# Patient Record
Sex: Male | Born: 1970 | Race: White | Hispanic: No | Marital: Married | Smoking: Never smoker
Health system: Southern US, Community
[De-identification: ages and names within clinical notes are randomized; demographics above are authoritative.]

## PROBLEM LIST (undated history)

## (undated) DIAGNOSIS — I10 Essential (primary) hypertension: Secondary | ICD-10-CM

## (undated) DIAGNOSIS — Z87442 Personal history of urinary calculi: Secondary | ICD-10-CM

## (undated) DIAGNOSIS — E7800 Pure hypercholesterolemia, unspecified: Secondary | ICD-10-CM

## (undated) DIAGNOSIS — F329 Major depressive disorder, single episode, unspecified: Secondary | ICD-10-CM

## (undated) DIAGNOSIS — F32A Depression, unspecified: Secondary | ICD-10-CM

## (undated) DIAGNOSIS — E785 Hyperlipidemia, unspecified: Secondary | ICD-10-CM

## (undated) HISTORY — DX: Hyperlipidemia, unspecified: E78.5

---

## 2008-02-05 ENCOUNTER — Ambulatory Visit: Admission: RE | Admit: 2008-02-05 | Discharge: 2008-02-05 | Payer: Self-pay | Admitting: Internal Medicine

## 2011-01-19 NOTE — Procedures (Signed)
Logan Garrett, Logan Garrett                 ACCOUNT NO.:  [id number]   MEDICAL RECORD NO.:  [id number]          PATIENT TYPE:  OUT   LOCATION:  SLEEP                         FACILITY:  [facility]   PHYSICIAN:  Kofi A. Gerilyn Pilgrim, M.D. DATE OF BIRTH:  Sep 20, 1970   DATE OF PROCEDURE:  02/05/2008  DATE OF DISCHARGE:                             SLEEP DISORDER REPORT   NOCTURNAL POLYSOMNOGRAPHY REPORT   REFERRING PHYSICIAN:  Catalina Pizza, M.D.   INDICATION:  A 40 year old man who presented with snoring, apnea, and  daytime sleepiness.  Epworth sleepiness scale 10.  BMI 28.   MEDICATION:  Claritin and testosterone.   SLEEP STAGE SUMMARY:  The total recording time is 401 minutes.  Sleep  efficiency 89%.  Sleep latency 60 minutes.  REM latency 176 minutes.  Stage N1 6%, N2 58%, N3 27%, and REM sleep 9%.   RESPIRATORY SUMMARY:  Baseline oxygen saturation 95% with lowest  saturation 80%.  The AHI is 13.   LIMB MOVEMENT SUMMARY:  PLM index is 0.7.   ELECTROCARDIOGRAM SUMMARY:  Average heart rate is 63 with no  dysrhythmias observed.   IMPRESSION:  Mild obstructive sleep apnea syndrome.  The patient did not  meet the criteria for a split night study, but could benefit from  positive pressure.  I suggest a home trial with autotitration unit or a  formal titration study.   Thanks for this referral.      Kofi A. Gerilyn Pilgrim, M.D.  Electronically Signed     KAD/MEDQ  D:  02/10/2008  T:  02/11/2008  Job:  [id number]

## 2012-04-13 ENCOUNTER — Encounter (HOSPITAL_COMMUNITY): Payer: Self-pay

## 2012-04-13 ENCOUNTER — Emergency Department (HOSPITAL_COMMUNITY)
Admission: EM | Admit: 2012-04-13 | Discharge: 2012-04-13 | Disposition: A | Discharge status: Home / Self Care | Payer: BC Managed Care – PPO | Attending: Emergency Medicine | Admitting: Emergency Medicine

## 2012-04-13 ENCOUNTER — Emergency Department (HOSPITAL_COMMUNITY): Payer: BC Managed Care – PPO

## 2012-04-13 DIAGNOSIS — Z79899 Other long term (current) drug therapy: Secondary | ICD-10-CM | POA: Insufficient documentation

## 2012-04-13 DIAGNOSIS — I10 Essential (primary) hypertension: Secondary | ICD-10-CM | POA: Insufficient documentation

## 2012-04-13 DIAGNOSIS — E7800 Pure hypercholesterolemia, unspecified: Secondary | ICD-10-CM | POA: Insufficient documentation

## 2012-04-13 DIAGNOSIS — N200 Calculus of kidney: Secondary | ICD-10-CM | POA: Insufficient documentation

## 2012-04-13 HISTORY — DX: Essential (primary) hypertension: I10

## 2012-04-13 HISTORY — DX: Pure hypercholesterolemia, unspecified: E78.00

## 2012-04-13 LAB — CBC WITH DIFFERENTIAL/PLATELET
Basophils Relative: 3 % — ABNORMAL HIGH (ref 0–1)
HCT: 46.2 % (ref 39.0–52.0)
Hemoglobin: 15.7 g/dL (ref 13.0–17.0)
Lymphocytes Relative: 45 % (ref 12–46)
MCHC: 34 g/dL (ref 30.0–36.0)
Monocytes Relative: 10 % (ref 3–12)
Neutro Abs: 2.8 10*3/uL (ref 1.7–7.7)
WBC: 7.3 10*3/uL (ref 4.0–10.5)

## 2012-04-13 LAB — URINALYSIS, ROUTINE W REFLEX MICROSCOPIC
Glucose, UA: NEGATIVE mg/dL
Ketones, ur: NEGATIVE mg/dL
pH: 6 (ref 5.0–8.0)

## 2012-04-13 LAB — COMPREHENSIVE METABOLIC PANEL
ALT: 58 U/L — ABNORMAL HIGH (ref 0–53)
Alkaline Phosphatase: 105 U/L (ref 39–117)
CO2: 27 mEq/L (ref 19–32)
GFR calc Af Amer: 82 mL/min — ABNORMAL LOW (ref >90)
GFR calc non Af Amer: 71 mL/min — ABNORMAL LOW (ref >90)
Glucose, Bld: 134 mg/dL — ABNORMAL HIGH (ref 70–99)
Potassium: 3.7 mEq/L (ref 3.5–5.1)
Sodium: 140 mEq/L (ref 135–145)

## 2012-04-13 LAB — URINE MICROSCOPIC-ADD ON

## 2012-04-13 MED ORDER — TAMSULOSIN HCL 0.4 MG PO CAPS: 0.4000 mg | ORAL_CAPSULE | Freq: Every day | ORAL | Status: DC

## 2012-04-13 MED ORDER — OXYCODONE-ACETAMINOPHEN 5-325 MG PO TABS: 2.0000 | ORAL_TABLET | ORAL | Status: AC | PRN

## 2012-04-13 MED ORDER — ONDANSETRON 8 MG PO TBDP: 8.0000 mg | ORAL_TABLET | Freq: Three times a day (TID) | ORAL | Status: AC | PRN

## 2012-04-13 NOTE — ED Notes (Signed)
Pt reports (L) lower back pain starting 0930 this am, no hx of the same, pt pale, restless. Pt denies hx of kidney stones

## 2012-04-13 NOTE — ED Provider Notes (Signed)
History     CSN: [id number]  Arrival date & time 04/13/12  1000   First MD Initiated Contact with Patient 04/13/12 1104      Chief Complaint  Patient presents with  . Back Pain    (Consider location/radiation/quality/duration/timing/severity/associated sxs/prior treatment) Patient is a 41 y.o. male presenting with back pain. The history is provided by the patient.  Back Pain    patient here with left-sided flank pain which began at 9:30 this morning. Pain is described as colicky. States that prior to this he thought he had a urinary tract infection and has been taking amoxicillin for this. Denies any history of kidney stone. Pain is sharp radiates down to his left groin. Denies any testicular pain. No penile drainage or discharge. No medications used prior to arrival he is currently pain-free  Past Medical History  Diagnosis Date  . High cholesterol   . Hypertension     History reviewed. No pertinent past surgical history.  History reviewed. No pertinent family history.  History  Substance Use Topics  . Smoking status: Never Smoker   . Smokeless tobacco: Not on file  . Alcohol Use: Yes      Review of Systems  Musculoskeletal: Positive for back pain.  All other systems reviewed and are negative.    Allergies  Review of patient's allergies indicates no known allergies.  Home Medications   Current Outpatient Rx  Name Route Sig Dispense Refill  . AMOXICILLIN 500 MG PO CAPS Oral Take 1,000 mg by mouth 2 (two) times daily.    Marland Kitchen LORATADINE 10 MG PO TABS Oral Take 10 mg by mouth daily.    Marland Kitchen ROSUVASTATIN CALCIUM 20 MG PO TABS Oral Take 20 mg by mouth daily.    . SERTRALINE HCL 100 MG PO TABS Oral Take 100 mg by mouth daily.    . TESTOSTERONE 20.25 MG/ACT (1.62%) TD GEL Transdermal Place 81 mg onto the skin daily. 81mg =4 pumps    . VALSARTAN-HYDROCHLOROTHIAZIDE 80-12.5 MG PO TABS Oral Take 1 tablet by mouth daily.      BP 145/88  Pulse 69  Temp 97.3 F (36.3 C)  (Oral)  Resp 22  SpO2 100%  Physical Exam  Nursing note and vitals reviewed. Constitutional: He is oriented to person, place, and time. He appears well-developed and well-nourished.  Non-toxic appearance. No distress.  HENT:  Head: Normocephalic and atraumatic.  Eyes: Conjunctivae, EOM and lids are normal. Pupils are equal, round, and reactive to light.  Neck: Normal range of motion. Neck supple. No tracheal deviation present. No mass present.  Cardiovascular: Normal rate, regular rhythm and normal heart sounds.  Exam reveals no gallop.   No murmur heard. Pulmonary/Chest: Effort normal and breath sounds normal. No stridor. No respiratory distress. He has no decreased breath sounds. He has no wheezes. He has no rhonchi. He has no rales.  Abdominal: Soft. Normal appearance and bowel sounds are normal. He exhibits no distension. There is no tenderness. There is no rebound and no CVA tenderness.  Musculoskeletal: Normal range of motion. He exhibits no edema and no tenderness.  Neurological: He is alert and oriented to person, place, and time. He has normal strength. No cranial nerve deficit or sensory deficit. GCS eye subscore is 4. GCS verbal subscore is 5. GCS motor subscore is 6.  Skin: Skin is warm and dry. No abrasion and no rash noted.  Psychiatric: He has a normal mood and affect. His speech is normal and behavior is normal.  ED Course  Procedures (including critical care time)   Labs Reviewed  URINALYSIS, ROUTINE W REFLEX MICROSCOPIC  CBC WITH DIFFERENTIAL  COMPREHENSIVE METABOLIC PANEL  URINE CULTURE   No results found.   No diagnosis found.    MDM  Pt with left sided kidney stone, no pain currently, stabel for d/c        Toy Baker, MD 04/13/12 1250
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
## 2017-10-21 ENCOUNTER — Other Ambulatory Visit (HOSPITAL_COMMUNITY): Payer: Self-pay | Admitting: Psychiatry

## 2017-10-21 ENCOUNTER — Encounter (HOSPITAL_COMMUNITY): Payer: Self-pay | Admitting: Psychiatry

## 2017-10-21 ENCOUNTER — Ambulatory Visit (INDEPENDENT_AMBULATORY_CARE_PROVIDER_SITE_OTHER): Payer: Managed Care, Other (non HMO) | Admitting: Psychiatry

## 2017-10-21 ENCOUNTER — Encounter (INDEPENDENT_AMBULATORY_CARE_PROVIDER_SITE_OTHER): Payer: Self-pay

## 2017-10-21 VITALS — BP 119/79 | HR 78 | Ht 76.0 in | Wt 234.0 lb

## 2017-10-21 DIAGNOSIS — Z79899 Other long term (current) drug therapy: Secondary | ICD-10-CM

## 2017-10-21 DIAGNOSIS — F0630 Mood disorder due to known physiological condition, unspecified: Secondary | ICD-10-CM

## 2017-10-21 DIAGNOSIS — Z566 Other physical and mental strain related to work: Secondary | ICD-10-CM | POA: Diagnosis not present

## 2017-10-21 DIAGNOSIS — F329 Major depressive disorder, single episode, unspecified: Secondary | ICD-10-CM | POA: Diagnosis not present

## 2017-10-21 DIAGNOSIS — F1099 Alcohol use, unspecified with unspecified alcohol-induced disorder: Secondary | ICD-10-CM | POA: Diagnosis not present

## 2017-10-21 LAB — TSH: TSH: 2.3 mIU/L (ref 0.40–4.50)

## 2017-10-21 MED ORDER — SERTRALINE HCL 100 MG PO TABS: 100.0000 mg | ORAL_TABLET | Freq: Every day | ORAL | 0 refills | Status: DC

## 2017-10-21 MED ORDER — ARIPIPRAZOLE 2 MG PO TABS: 2.0000 mg | ORAL_TABLET | Freq: Every day | ORAL | 0 refills | Status: DC

## 2017-10-21 NOTE — Patient Instructions (Signed)
1. Continue sertraline 100 mg daily  2. Start Abilify 2 mg daily  3. Return to clinic in one month for 30 mins 4. Referral to therapy 5. Order thyroid test

## 2017-10-26 NOTE — Addendum Note (Signed)
Addended by: Luella CookMCINTYRE, Brevan Luberto E on: 10/26/2017 01:54 PM   Modules accepted: Orders

## 2017-11-16 ENCOUNTER — Other Ambulatory Visit (HOSPITAL_COMMUNITY): Payer: Self-pay | Admitting: Psychiatry

## 2017-11-16 MED ORDER — SERTRALINE HCL 100 MG PO TABS: 100.0000 mg | ORAL_TABLET | Freq: Every day | ORAL | 0 refills | Status: DC

## 2017-11-17 ENCOUNTER — Telehealth (HOSPITAL_COMMUNITY): Payer: Self-pay | Admitting: Psychiatry

## 2017-11-17 ENCOUNTER — Other Ambulatory Visit (HOSPITAL_COMMUNITY): Payer: Self-pay | Admitting: Psychiatry

## 2017-11-17 MED ORDER — ARIPIPRAZOLE 2 MG PO TABS: 2.0000 mg | ORAL_TABLET | Freq: Every day | ORAL | 0 refills | Status: DC

## 2017-11-17 NOTE — Telephone Encounter (Signed)
Received request for Abilify, ordered for a month. Please contact the patient to make follow up appointment.

## 2017-11-18 ENCOUNTER — Telehealth (HOSPITAL_COMMUNITY): Payer: Self-pay

## 2017-11-18 NOTE — Telephone Encounter (Signed)
Neysa HotterHisada, Reina, MD  Milus Mallickatum, Mertha Clyatt L, CMA        Received request for sertraline and ordered for a month. Please contact the patient to make follow up appointment     Called patient and left message on voicemail at home number asking the patient to please call the office to schedule a follow up appt. Also left message that prescription was sent to the pharmacy
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
## 2017-11-29 ENCOUNTER — Telehealth (HOSPITAL_COMMUNITY): Payer: Self-pay | Admitting: Psychiatry

## 2017-11-29 ENCOUNTER — Ambulatory Visit (INDEPENDENT_AMBULATORY_CARE_PROVIDER_SITE_OTHER): Payer: Managed Care, Other (non HMO) | Admitting: Psychiatry

## 2017-11-29 ENCOUNTER — Encounter (HOSPITAL_COMMUNITY): Payer: Self-pay | Admitting: Psychiatry

## 2017-11-29 VITALS — BP 128/90 | HR 87 | Ht 76.0 in | Wt 239.0 lb

## 2017-11-29 DIAGNOSIS — F39 Unspecified mood [affective] disorder: Secondary | ICD-10-CM

## 2017-11-29 DIAGNOSIS — Z79899 Other long term (current) drug therapy: Secondary | ICD-10-CM | POA: Diagnosis not present

## 2017-11-29 DIAGNOSIS — F0630 Mood disorder due to known physiological condition, unspecified: Secondary | ICD-10-CM

## 2017-11-29 DIAGNOSIS — F1011 Alcohol abuse, in remission: Secondary | ICD-10-CM

## 2017-11-29 MED ORDER — SERTRALINE HCL 100 MG PO TABS: 100.0000 mg | ORAL_TABLET | Freq: Every day | ORAL | 0 refills | Status: DC

## 2017-11-29 MED ORDER — ARIPIPRAZOLE 2 MG PO TABS: 2.0000 mg | ORAL_TABLET | Freq: Every day | ORAL | 0 refills | Status: DC

## 2017-11-29 NOTE — Telephone Encounter (Signed)
TSH 2.3  Please update the patient that the blood test (thyroid) in Feb is within normal range.

## 2017-11-29 NOTE — Patient Instructions (Signed)
1. Continue sertraline 100 mg daily  2. Continue Abilify 2 mg daily  3. Return to clinic in two months for 30 mins

## 2017-11-30 ENCOUNTER — Encounter (HOSPITAL_COMMUNITY): Payer: Self-pay

## 2018-07-31 ENCOUNTER — Other Ambulatory Visit: Payer: Self-pay

## 2018-07-31 ENCOUNTER — Ambulatory Visit (HOSPITAL_COMMUNITY)
Admission: EM | Admit: 2018-07-31 | Discharge: 2018-07-31 | Disposition: A | Discharge status: Home / Self Care | Payer: 59 | Source: Ambulatory Visit | Attending: Orthopedic Surgery | Admitting: Orthopedic Surgery

## 2018-07-31 ENCOUNTER — Encounter (HOSPITAL_COMMUNITY)
Admission: EM | Disposition: A | Discharge status: Home / Self Care | Payer: Self-pay | Source: Home / Self Care | Attending: Emergency Medicine

## 2018-07-31 ENCOUNTER — Emergency Department (HOSPITAL_COMMUNITY): Payer: 59

## 2018-07-31 ENCOUNTER — Encounter (HOSPITAL_COMMUNITY): Payer: Self-pay | Admitting: Emergency Medicine

## 2018-07-31 ENCOUNTER — Encounter (HOSPITAL_COMMUNITY): Payer: Self-pay | Admitting: *Deleted

## 2018-07-31 ENCOUNTER — Inpatient Hospital Stay (HOSPITAL_COMMUNITY): Payer: 59 | Admitting: Certified Registered Nurse Anesthetist

## 2018-07-31 ENCOUNTER — Encounter (HOSPITAL_COMMUNITY)
Admission: EM | Disposition: A | Discharge status: Home / Self Care | Payer: Self-pay | Source: Ambulatory Visit | Attending: Orthopedic Surgery

## 2018-07-31 ENCOUNTER — Emergency Department (HOSPITAL_COMMUNITY)
Admission: EM | Admit: 2018-07-31 | Discharge: 2018-07-31 | Disposition: A | Discharge status: Home / Self Care | Payer: 59 | Source: Home / Self Care | Attending: Emergency Medicine | Admitting: Emergency Medicine

## 2018-07-31 DIAGNOSIS — G5602 Carpal tunnel syndrome, left upper limb: Secondary | ICD-10-CM | POA: Insufficient documentation

## 2018-07-31 DIAGNOSIS — S022XXB Fracture of nasal bones, initial encounter for open fracture: Secondary | ICD-10-CM

## 2018-07-31 DIAGNOSIS — S63095A Other dislocation of left wrist and hand, initial encounter: Secondary | ICD-10-CM | POA: Insufficient documentation

## 2018-07-31 DIAGNOSIS — Y929 Unspecified place or not applicable: Secondary | ICD-10-CM | POA: Insufficient documentation

## 2018-07-31 DIAGNOSIS — W1830XA Fall on same level, unspecified, initial encounter: Secondary | ICD-10-CM | POA: Diagnosis not present

## 2018-07-31 DIAGNOSIS — I10 Essential (primary) hypertension: Secondary | ICD-10-CM | POA: Insufficient documentation

## 2018-07-31 DIAGNOSIS — Z79899 Other long term (current) drug therapy: Secondary | ICD-10-CM | POA: Diagnosis not present

## 2018-07-31 HISTORY — DX: Personal history of urinary calculi: Z87.442

## 2018-07-31 HISTORY — PX: ORIF WRIST FRACTURE: SHX2133

## 2018-07-31 LAB — CBC
HCT: 50.1 % (ref 39.0–52.0)
Hemoglobin: 16.4 g/dL (ref 13.0–17.0)
MCH: 31.8 pg (ref 26.0–34.0)
MCHC: 32.7 g/dL (ref 30.0–36.0)
MCV: 97.1 fL (ref 80.0–100.0)
NRBC: 0 % (ref 0.0–0.2)
PLATELETS: 252 10*3/uL (ref 150–400)
RBC: 5.16 MIL/uL (ref 4.22–5.81)
RDW: 12.4 % (ref 11.5–15.5)
WBC: 12 10*3/uL — AB (ref 4.0–10.5)

## 2018-07-31 SURGERY — OPEN REDUCTION INTERNAL FIXATION (ORIF) WRIST FRACTURE
Anesthesia: General | Laterality: Left

## 2018-07-31 SURGERY — OPEN REDUCTION INTERNAL FIXATION (ORIF) WRIST FRACTURE
Anesthesia: Choice | Laterality: Left

## 2018-07-31 MED ORDER — ONDANSETRON HCL 4 MG/2ML IJ SOLN: 4.0000 mg | Freq: Once | INTRAMUSCULAR | Status: DC

## 2018-07-31 MED ORDER — GLYCOPYRROLATE 0.2 MG/ML IJ SOLN
INTRAMUSCULAR | Status: DC | PRN
  Administered 2018-07-31: 0.2 mg via INTRAVENOUS

## 2018-07-31 MED ORDER — FENTANYL CITRATE (PF) 100 MCG/2ML IJ SOLN
50.0000 ug | Freq: Once | INTRAMUSCULAR | Status: AC
  Administered 2018-07-31: 50 ug via INTRAVENOUS

## 2018-07-31 MED ORDER — ONDANSETRON HCL 4 MG/2ML IJ SOLN
INTRAMUSCULAR | Status: DC | PRN
  Administered 2018-07-31: 4 mg via INTRAVENOUS

## 2018-07-31 MED ORDER — CEFAZOLIN SODIUM-DEXTROSE 2-3 GM-%(50ML) IV SOLR
INTRAVENOUS | Status: DC | PRN
  Administered 2018-07-31: 2 g via INTRAVENOUS

## 2018-07-31 MED ORDER — PROPOFOL 10 MG/ML IV BOLUS
INTRAVENOUS | Status: DC | PRN
  Administered 2018-07-31: 150 mg via INTRAVENOUS
  Administered 2018-07-31 (×2): 50 mg via INTRAVENOUS

## 2018-07-31 MED ORDER — TETANUS-DIPHTH-ACELL PERTUSSIS 5-2.5-18.5 LF-MCG/0.5 IM SUSP
0.5000 mL | Freq: Once | INTRAMUSCULAR | Status: AC
  Administered 2018-07-31: 0.5 mL via INTRAMUSCULAR

## 2018-07-31 MED ORDER — SUCCINYLCHOLINE CHLORIDE 200 MG/10ML IV SOSY
PREFILLED_SYRINGE | INTRAVENOUS | Status: DC | PRN
  Administered 2018-07-31: 100 mg via INTRAVENOUS

## 2018-07-31 MED ORDER — OXYCODONE-ACETAMINOPHEN 5-325 MG PO TABS: 1.0000 | ORAL_TABLET | ORAL | 0 refills | Status: AC | PRN

## 2018-07-31 MED ORDER — DEXAMETHASONE SODIUM PHOSPHATE 10 MG/ML IJ SOLN
INTRAMUSCULAR | Status: DC | PRN
  Administered 2018-07-31: 10 mg via INTRAVENOUS

## 2018-07-31 MED ORDER — CEFAZOLIN SODIUM-DEXTROSE 2-4 GM/100ML-% IV SOLN
INTRAVENOUS | Status: AC
  Filled 2018-07-31: qty 100

## 2018-07-31 MED ORDER — 0.9 % SODIUM CHLORIDE (POUR BTL) OPTIME
TOPICAL | Status: DC | PRN
  Administered 2018-07-31: 1000 mL

## 2018-07-31 MED ORDER — LIDOCAINE 2% (20 MG/ML) 5 ML SYRINGE
INTRAMUSCULAR | Status: DC | PRN
  Administered 2018-07-31: 100 mg via INTRAVENOUS

## 2018-07-31 MED ORDER — BACITRACIN ZINC 500 UNIT/GM EX OINT
TOPICAL_OINTMENT | CUTANEOUS | Status: AC
  Filled 2018-07-31: qty 0.9

## 2018-07-31 MED ORDER — FENTANYL CITRATE (PF) 100 MCG/2ML IJ SOLN
INTRAMUSCULAR | Status: AC
  Administered 2018-07-31: 50 ug via INTRAVENOUS
  Filled 2018-07-31: qty 2

## 2018-07-31 MED ORDER — MIDAZOLAM HCL 2 MG/2ML IJ SOLN
INTRAMUSCULAR | Status: AC
  Filled 2018-07-31: qty 2

## 2018-07-31 MED ORDER — HYDROMORPHONE HCL 1 MG/ML IJ SOLN: 0.2500 mg | INTRAMUSCULAR | Status: DC | PRN

## 2018-07-31 MED ORDER — SUFENTANIL CITRATE 50 MCG/ML IV SOLN
INTRAVENOUS | Status: DC | PRN
  Administered 2018-07-31: 10 ug via INTRAVENOUS
  Administered 2018-07-31: 20 ug via INTRAVENOUS

## 2018-07-31 MED ORDER — LACTATED RINGERS IV SOLN
INTRAVENOUS | Status: DC | PRN
  Administered 2018-07-31 (×2): via INTRAVENOUS

## 2018-07-31 MED ORDER — ACETAMINOPHEN 325 MG PO TABS
650.0000 mg | ORAL_TABLET | Freq: Once | ORAL | Status: AC
  Administered 2018-07-31: 650 mg via ORAL
  Filled 2018-07-31: qty 2

## 2018-07-31 MED ORDER — BUPIVACAINE-EPINEPHRINE (PF) 0.5% -1:200000 IJ SOLN
INTRAMUSCULAR | Status: DC | PRN
  Administered 2018-07-31: 30 mL via PERINEURAL

## 2018-07-31 MED ORDER — EPHEDRINE SULFATE 50 MG/ML IJ SOLN
INTRAMUSCULAR | Status: DC | PRN
  Administered 2018-07-31 (×3): 10 mg via INTRAVENOUS

## 2018-07-31 MED ORDER — HYDROMORPHONE HCL 1 MG/ML IJ SOLN: 1.0000 mg | Freq: Once | INTRAMUSCULAR | Status: DC

## 2018-07-31 SURGICAL SUPPLY — 60 items
BANDAGE ACE 3X5.8 VEL STRL LF (GAUZE/BANDAGES/DRESSINGS) ×3 IMPLANT
BANDAGE ACE 4X5 VEL STRL LF (GAUZE/BANDAGES/DRESSINGS) ×3 IMPLANT
BANDAGE ELASTIC 3 VELCRO ST LF (GAUZE/BANDAGES/DRESSINGS) ×3 IMPLANT
BANDAGE ELASTIC 4 VELCRO ST LF (GAUZE/BANDAGES/DRESSINGS) ×3 IMPLANT
BNDG CMPR 9X4 STRL LF SNTH (GAUZE/BANDAGES/DRESSINGS) ×1
BNDG ESMARK 4X9 LF (GAUZE/BANDAGES/DRESSINGS) ×3 IMPLANT
BNDG GAUZE ELAST 4 BULKY (GAUZE/BANDAGES/DRESSINGS) ×3 IMPLANT
CORDS BIPOLAR (ELECTRODE) ×3 IMPLANT
COVER SURGICAL LIGHT HANDLE (MISCELLANEOUS) ×3 IMPLANT
COVER WAND RF STERILE (DRAPES) ×3 IMPLANT
CUFF TOURNIQUET SINGLE 18IN (TOURNIQUET CUFF) ×3 IMPLANT
CUFF TOURNIQUET SINGLE 24IN (TOURNIQUET CUFF) IMPLANT
DRAIN PENROSE 1/4X12 LTX STRL (WOUND CARE) ×3 IMPLANT
DRAPE OEC MINIVIEW 54X84 (DRAPES) IMPLANT
DRAPE SURG 17X23 STRL (DRAPES) ×3 IMPLANT
DURAPREP 26ML APPLICATOR (WOUND CARE) ×3 IMPLANT
ELECT REM PT RETURN 9FT ADLT (ELECTROSURGICAL)
ELECTRODE REM PT RTRN 9FT ADLT (ELECTROSURGICAL) IMPLANT
GAUZE SPONGE 4X4 12PLY STRL (GAUZE/BANDAGES/DRESSINGS) ×3 IMPLANT
GAUZE SPONGE 4X4 12PLY STRL LF (GAUZE/BANDAGES/DRESSINGS) ×6 IMPLANT
GAUZE XEROFORM 1X8 LF (GAUZE/BANDAGES/DRESSINGS) ×3 IMPLANT
GLOVE SURG SYN 8.0 (GLOVE) ×3 IMPLANT
GOWN STRL REUS W/ TWL LRG LVL3 (GOWN DISPOSABLE) ×1 IMPLANT
GOWN STRL REUS W/ TWL XL LVL3 (GOWN DISPOSABLE) ×1 IMPLANT
GOWN STRL REUS W/TWL LRG LVL3 (GOWN DISPOSABLE) ×3
GOWN STRL REUS W/TWL XL LVL3 (GOWN DISPOSABLE) ×3
GUIDEWIRE ORTH 6X062XTROC NS (WIRE) ×3 IMPLANT
K-WIRE .062 (WIRE) ×9
KIT BASIN OR (CUSTOM PROCEDURE TRAY) ×3 IMPLANT
KIT TURNOVER KIT B (KITS) ×3 IMPLANT
MANIFOLD NEPTUNE II (INSTRUMENTS) ×3 IMPLANT
NEEDLE HYPO 25GX1X1/2 BEV (NEEDLE) IMPLANT
NEEDLE HYPO 25X1 1.5 SAFETY (NEEDLE) IMPLANT
NS IRRIG 1000ML POUR BTL (IV SOLUTION) ×3 IMPLANT
PACK ORTHO EXTREMITY (CUSTOM PROCEDURE TRAY) ×3 IMPLANT
PAD ARMBOARD 7.5X6 YLW CONV (MISCELLANEOUS) ×6 IMPLANT
PAD CAST 3X4 CTTN HI CHSV (CAST SUPPLIES) ×1 IMPLANT
PAD CAST 4YDX4 CTTN HI CHSV (CAST SUPPLIES) ×1 IMPLANT
PADDING CAST COTTON 3X4 STRL (CAST SUPPLIES) ×3
PADDING CAST COTTON 4X4 STRL (CAST SUPPLIES) ×3
PADDING CAST SYNTHETIC 3 NS LF (CAST SUPPLIES) ×2
PADDING CAST SYNTHETIC 3X4 NS (CAST SUPPLIES) ×1 IMPLANT
PADDING CAST SYNTHETIC 4 (CAST SUPPLIES) ×2
PADDING CAST SYNTHETIC 4X4 STR (CAST SUPPLIES) ×1 IMPLANT
PENCIL BUTTON HOLSTER BLD 10FT (ELECTRODE) IMPLANT
SPLINT FIBERGLASS 4X15 (CAST SUPPLIES) ×3 IMPLANT
SPONGE LAP 4X18 RFD (DISPOSABLE) ×6 IMPLANT
SUT 0 0 DBL MH NEEDLE ETHIBOND (SUTURE) ×3 IMPLANT
SUT ETHIBOND NAB CT1 #1 30IN (SUTURE) ×3 IMPLANT
SUT ETHILON 4 0 PS 2 18 (SUTURE) ×12 IMPLANT
SUT PROLENE 3 0 PS 2 (SUTURE) IMPLANT
SUT VIC AB 3-0 FS2 27 (SUTURE) ×3 IMPLANT
SUT VICRYL 4-0 PS2 18IN ABS (SUTURE) IMPLANT
SYR CONTROL 10ML LL (SYRINGE) IMPLANT
TOWEL OR 17X24 6PK STRL BLUE (TOWEL DISPOSABLE) ×3 IMPLANT
TOWEL OR 17X26 10 PK STRL BLUE (TOWEL DISPOSABLE) ×3 IMPLANT
TUBE CONNECTING 12'X1/4 (SUCTIONS)
TUBE CONNECTING 12X1/4 (SUCTIONS) IMPLANT
UNDERPAD 30X30 (UNDERPADS AND DIAPERS) ×3 IMPLANT
WATER STERILE IRR 1000ML POUR (IV SOLUTION) ×3 IMPLANT

## 2018-07-31 NOTE — Transfer of Care (Signed)
Immediate Anesthesia Transfer of Care Note  Patient: Logan Garrett  Procedure(s) Performed: OPEN REDUCTION INTERNAL FIXATION (ORIF) LEFT PERILUNATE DISLOCATION (Left )  Patient Location: PACU  Anesthesia Type:General and GA combined with regional for post-op pain  Level of Consciousness: oriented, sedated, drowsy, patient cooperative and responds to stimulation  Airway & Oxygen Therapy: Patient Spontanous Breathing  Post-op Assessment: Report given to RN and Post -op Vital signs reviewed and stable  Post vital signs: Reviewed and stable  Last Vitals:  Vitals Value Taken Time  BP 150/90 07/31/2018  9:51 PM  Temp    Pulse 94 07/31/2018  9:51 PM  Resp 12 07/31/2018  9:51 PM  SpO2 97 % 07/31/2018  9:51 PM  Vitals shown include unvalidated device data.  Last Pain:  Vitals:   07/31/18 1942  PainSc: 0-No pain      Patients Stated Pain Goal: 0 (07/31/18 1924)  Complications: No apparent anesthesia complications

## 2018-07-31 NOTE — ED Provider Notes (Signed)
[facility] EMERGENCY DEPARTMENT Provider Note   CSN: [id number] Arrival date & time: 07/31/18  1249     History   Chief Complaint Chief Complaint  Patient presents with  . Fall    HPI Logan Garrett is a 47 y.o. male.  HPI   Logan Garrett is a 47 y.o. male who presents to the Emergency Department complaining of left wrist pain and pain to his nose after a fall.  He states that he was cutting wood and slipped and fell face down onto a piece of wood.  He reports pain and bleeding of his nose with swelling.  And pain with movement to his left wrist.  He is applied pressure to his nose with minimal improvement of the bleeding.  He denies headache, dizziness, LOC, neck or back pain and vomiting.  Past Medical History:  Diagnosis Date  . High cholesterol   . Hyperlipidemia   . Hypertension     Patient Active Problem List   Diagnosis Date Noted  . Mood disorder in conditions classified elsewhere 10/21/2017    History reviewed. No pertinent surgical history.    Home Medications    Prior to Admission medications   Medication Sig Start Date End Date Taking? Authorizing Provider  amoxicillin (AMOXIL) 500 MG capsule Take 1,000 mg by mouth 2 (two) times daily.    [provider]  ARIPiprazole (ABILIFY) 2 MG tablet Take 1 tablet (2 mg total) by mouth daily. 11/29/17   Neysa Hotter, MD  loratadine (CLARITIN) 10 MG tablet Take 10 mg by mouth daily.    [provider]  rosuvastatin (CRESTOR) 20 MG tablet Take 20 mg by mouth daily.    [provider]  rosuvastatin (CRESTOR) 20 MG tablet Take 20 mg by mouth daily.    [provider]  sertraline (ZOLOFT) 100 MG tablet Take 100 mg by mouth daily.    [provider]  sertraline (ZOLOFT) 100 MG tablet Take 1 tablet (100 mg total) by mouth daily. 11/29/17   Neysa Hotter, MD  Tamsulosin HCl (FLOMAX) 0.4 MG CAPS Take 1 capsule (0.4 mg total) by mouth daily. 04/13/12   Lorre Nick, MD    Testosterone (ANDROGEL PUMP) 20.25 MG/ACT (1.62%) GEL Place 81 mg onto the skin daily. 81mg =4 pumps    [provider]  valsartan-hydrochlorothiazide (DIOVAN-HCT) 80-12.5 MG per tablet Take 1 tablet by mouth daily.    [provider]  valsartan-hydrochlorothiazide (DIOVAN-HCT) 80-12.5 MG tablet Take 1 tablet by mouth daily.    [provider]    Family History No family history on file.  Social History Social History   Tobacco Use  . Smoking status: Never Smoker  . Smokeless tobacco: Never Used  Substance Use Topics  . Alcohol use: Yes    Comment: occ   . Drug use: No     Allergies   Patient has no known allergies.   Review of Systems Review of Systems  Constitutional: Negative for chills and fever.  HENT:       Pain, swelling and Laceration nose  Eyes: Negative for visual disturbance.  Respiratory: Negative for chest tightness and shortness of breath.   Cardiovascular: Negative for chest pain.  Gastrointestinal: Negative for nausea and vomiting.  Genitourinary: Negative for difficulty urinating and dysuria.  Musculoskeletal: Positive for arthralgias (left wrist pain and swelling) and joint swelling. Negative for back pain and neck pain.  Skin: Negative for color change and wound.  Neurological: Negative for dizziness, weakness, numbness and headaches.  Psychiatric/Behavioral: Negative for confusion.     Physical Exam Updated Vital Signs BP 121/75 (BP Location: Right Arm)   Pulse 69   Temp (!) 97.5 F (36.4 C) (Oral)   Resp 16   Ht 6\' 4"  (1.93 m)   Wt 108.9 kg   SpO2 98%   BMI 29.21 kg/m   Physical Exam  Constitutional: He appears well-developed. No distress.  HENT:  Nose:    Mouth/Throat: Uvula is midline, oropharynx is clear and moist and mucous membranes are normal.  Small, Irregular laceration to bridge of the nose, small amt of bleeding with moderate edema.  No active epistaxis.    Eyes: Pupils are equal, round, and  reactive to light. EOM are normal.  Cardiovascular: Normal rate, regular rhythm and intact distal pulses.  No murmur heard. Pulmonary/Chest: Effort normal and breath sounds normal. No respiratory distress.  Musculoskeletal: Normal range of motion. He exhibits edema and tenderness.  Mild to moderate edema and tenderness to palpation of the distal left hand and wrist.  No obvious bony deformity.  Compartments are soft.  No tenderness of the elbow.  Neurological: He is alert. No sensory deficit.  Skin: Skin is warm. Capillary refill takes less than 2 seconds.  Nursing note and vitals reviewed.    ED Treatments / Results  Labs (all labs ordered are listed, but only abnormal results are displayed) Labs Reviewed - No data to display  EKG None  Radiology Dg Nasal Bones  Result Date: 07/31/2018 CLINICAL DATA:  Fall from tree trunk while cutting limbs. Nasal pain and swelling. EXAM: NASAL BONES - 3+ VIEW COMPARISON:  None FINDINGS: Minimally displaced comminuted distal nasal bone fractures are present bilaterally. A soft tissue laceration is present the nose as well. Maxilla is otherwise intact. The left maxillary sinus is opacified. Mandible appears to be intact. Visualized skull is otherwise normal. IMPRESSION: 1. Comminuted minimally displaced distal nasal bone fractures bilaterally. 2. Overlying soft tissue laceration of the nose. 3. Left maxillary sinus opacification. While this likely represents sinus disease, occult injury of the left maxilla could result in hemosinus giving this appearance. CT of the face without contrast is recommended further evaluation. Electronically Signed   By: Marin Roberts M.D.   On: 07/31/2018 13:37   Dg Wrist Complete Left  Result Date: 07/31/2018 CLINICAL DATA:  Pain following fall EXAM: LEFT WRIST - COMPLETE 3+ VIEW COMPARISON:  None. FINDINGS: Frontal, oblique, lateral, and ulnar deviation scaphoid images were obtained. There is perilunate dislocation.  No fracture evident. No appreciable arthropathy. IMPRESSION: Perilunate dislocation. No fracture evident. No appreciable arthropathy. Electronically Signed   By: Bretta Bang III M.D.   On: 07/31/2018 13:34   Ct Maxillofacial Wo Contrast  Result Date: 07/31/2018 CLINICAL DATA:  47 year old male fell into tree. Subsequent encounter. EXAM: CT MAXILLOFACIAL WITHOUT CONTRAST TECHNIQUE: Multidetector CT imaging of the maxillofacial structures was performed. Multiplanar CT image reconstructions were also generated. COMPARISON:  Plain film nasal bones 07/31/2018. FINDINGS: Osseous: Comminuted nasal bone fracture bilaterally with inward and slight leftward displacement. Impaction fracture of the anterior nasal septum. Associated hemorrhage. No other fracture noted. Orbits: Orbital structures appear to be grossly intact. Sinuses: Polypoid opacification left maxillary sinus which may represent retention cyst with surrounding mucosal thickening. Polypoid opacification inferior right maxillary sinus. Partial opacification inferior medial aspect frontal sinuses more notable on left. Opacification ethmoid sinus air cells bilaterally. Mucosal thickening sphenoid sinuses Soft tissues: Carotid bifurcation calcifications. Limited intracranial: No worrisome abnormality. Other: Visualized aspect of the cervical spine  without fracture noted. Cervical spondylotic changes C3-4 through C5-6 incompletely assessed. Mastoid air cells and middle ear cavities are clear. IMPRESSION: 1. Comminuted nasal bone fracture bilaterally with inward and slight leftward displacement of fracture fragments. Impaction fracture of the anterior nasal septum. Associated hemorrhage. 2. No other fracture noted. 3. Pansinus mucosal thickening/opacification as detailed above. 4. Cervical spondylotic changes C3-4 through C5-6 incompletely assessed. 5. Carotid bifurcation calcifications advanced for patient's age. Electronically Signed   By: Lacy DuverneySteven  Olson  M.D.   On: 07/31/2018 15:39    Procedures Procedures (including critical care time)  Medications Ordered in ED Medications  acetaminophen (TYLENOL) tablet 650 mg (has no administration in time range)     Initial Impression / Assessment and Plan / ED Course  I have reviewed the triage vital signs and the nursing notes.  Pertinent labs & imaging results that were available during my care of the patient were reviewed by me and considered in my medical decision making (see chart for details).      Pt with injury of the nose and left wrist.  Moderate edema of the nose.  No epistaxis.  No bleeding into the oropharynx.    Consulted Dr. Ronie SpiesWeingold's nurse who and discussed wrist findings.  She will relay information to Dr. Mina MarbleWeingold and call back.  1722 Dr. Mina MarbleWeingold called back request patient to be sent directly to short stay and remain n.p.o.  Patient placed in sugar tong splint and sling. Pt prefers to go by POV.   1755  Consulted Dr. Ulice Boldillingham regarding the nasal bone fractures.  She will see pt in her office on Wednesday   Final Clinical Impressions(s) / ED Diagnoses   Final diagnoses:  Closed perilunate dislocation of left wrist, initial encounter  Open fracture of nasal bone, initial encounter    ED Discharge Orders    None       Pauline Ausriplett, Josalyn Dettmann, PA-C 07/31/18 1836    Vanetta MuldersZackowski, Scott, MD 08/08/18 [phone]

## 2018-07-31 NOTE — Op Note (Signed)
See note [phone]

## 2018-07-31 NOTE — Anesthesia Preprocedure Evaluation (Addendum)
Anesthesia Evaluation  Patient identified by MRN, date of birth, ID band Patient awake    Reviewed: Allergy & Precautions, H&P , NPO status , Patient's Chart, lab work & pertinent test results  Airway Mallampati: III  TM Distance: >3 FB Neck ROM: Full    Dental no notable dental hx. (+) Teeth Intact, Dental Advisory Given   Pulmonary neg pulmonary ROS,    Pulmonary exam normal breath sounds clear to auscultation       Cardiovascular hypertension, Pt. on medications  Rhythm:Regular Rate:Normal     Neuro/Psych negative neurological ROS  negative psych ROS   GI/Hepatic negative GI ROS, Neg liver ROS,   Endo/Other  negative endocrine ROS  Renal/GU negative Renal ROS  negative genitourinary   Musculoskeletal   Abdominal   Peds  Hematology negative hematology ROS (+)   Anesthesia Other Findings   Reproductive/Obstetrics negative OB ROS                            Anesthesia Physical Anesthesia Plan  ASA: II  Anesthesia Plan: General   Post-op Pain Management:  Regional for Post-op pain   Induction: Intravenous, Rapid sequence and Cricoid pressure planned  PONV Risk Score and Plan: 3 and Ondansetron, Dexamethasone and Midazolam  Airway Management Planned: Oral ETT  Additional Equipment:   Intra-op Plan:   Post-operative Plan: Extubation in OR  Informed Consent: I have reviewed the patients History and Physical, chart, labs and discussed the procedure including the risks, benefits and alternatives for the proposed anesthesia with the patient or authorized representative who has indicated his/her understanding and acceptance.   Dental advisory given  Plan Discussed with: CRNA  Anesthesia Plan Comments:        Anesthesia Quick Evaluation

## 2018-07-31 NOTE — ED Triage Notes (Signed)
Pt fell outside and hit face on a tree. Pt has laceration and edema to nose. Pt also c/o LT wrist pain.

## 2018-07-31 NOTE — Consult Note (Signed)
Reason for Consult: Left wrist perilunate dislocation Referring Physician: Jearld PiesZackowski  Logan Garrett is an 47 y.o. male.  HPI: Logan Garrett is a very pleasant 47 year old male status post fall onto an outstretched upper extremity on the left with radiographs that reveal a left wrist perilunate dislocation.  Past Medical History:  Diagnosis Date  . High cholesterol   . Hyperlipidemia   . Hypertension     No past surgical history on file.  No family history on file.  Social History:  reports that he has never smoked. He has never used smokeless tobacco. He reports that he drinks alcohol. He reports that he does not use drugs.  Allergies: No Known Allergies  Medications: Scheduled:   Results for orders placed or performed during the hospital encounter of 07/31/18 (from the past 48 hour(s))  CBC     Status: Abnormal   Collection Time: 07/31/18  5:41 PM  Result Value Ref Range   WBC 12.0 (H) 4.0 - 10.5 K/uL   RBC 5.16 4.22 - 5.81 MIL/uL   Hemoglobin 16.4 13.0 - 17.0 g/dL   HCT 16.150.1 09.639.0 - 04.552.0 %   MCV 97.1 80.0 - 100.0 fL   MCH 31.8 26.0 - 34.0 pg   MCHC 32.7 30.0 - 36.0 g/dL   RDW 40.912.4 81.111.5 - 91.415.5 %   Platelets 252 150 - 400 K/uL   nRBC 0.0 0.0 - 0.2 %    Comment: Performed at [facility], [address] [place], [place] [address]    Dg Nasal Bones  Result Date: 07/31/2018 CLINICAL DATA:  Fall from tree trunk while cutting limbs. Nasal pain and swelling. EXAM: NASAL BONES - 3+ VIEW COMPARISON:  None FINDINGS: Minimally displaced comminuted distal nasal bone fractures are present bilaterally. A soft tissue laceration is present the nose as well. Maxilla is otherwise intact. The left maxillary sinus is opacified. Mandible appears to be intact. Visualized skull is otherwise normal. IMPRESSION: 1. Comminuted minimally displaced distal nasal bone fractures bilaterally. 2. Overlying soft tissue laceration of the nose. 3. Left maxillary sinus opacification. While this likely represents  sinus disease, occult injury of the left maxilla could result in hemosinus giving this appearance. CT of the face without contrast is recommended further evaluation. Electronically Signed   By: Marin Robertshristopher  Mattern M.D.   On: 07/31/2018 13:37   Dg Wrist Complete Left  Result Date: 07/31/2018 CLINICAL DATA:  Pain following fall EXAM: LEFT WRIST - COMPLETE 3+ VIEW COMPARISON:  None. FINDINGS: Frontal, oblique, lateral, and ulnar deviation scaphoid images were obtained. There is perilunate dislocation. No fracture evident. No appreciable arthropathy. IMPRESSION: Perilunate dislocation. No fracture evident. No appreciable arthropathy. Electronically Signed   By: Bretta BangWilliam  Woodruff III M.D.   On: 07/31/2018 13:34   Ct Maxillofacial Wo Contrast  Result Date: 07/31/2018 CLINICAL DATA:  47 year old male fell into tree. Subsequent encounter. EXAM: CT MAXILLOFACIAL WITHOUT CONTRAST TECHNIQUE: Multidetector CT imaging of the maxillofacial structures was performed. Multiplanar CT image reconstructions were also generated. COMPARISON:  Plain film nasal bones 07/31/2018. FINDINGS: Osseous: Comminuted nasal bone fracture bilaterally with inward and slight leftward displacement. Impaction fracture of the anterior nasal septum. Associated hemorrhage. No other fracture noted. Orbits: Orbital structures appear to be grossly intact. Sinuses: Polypoid opacification left maxillary sinus which may represent retention cyst with surrounding mucosal thickening. Polypoid opacification inferior right maxillary sinus. Partial opacification inferior medial aspect frontal sinuses more notable on left. Opacification ethmoid sinus air cells bilaterally. Mucosal thickening sphenoid sinuses Soft tissues: Carotid bifurcation calcifications. Limited intracranial:  No worrisome abnormality. Other: Visualized aspect of the cervical spine without fracture noted. Cervical spondylotic changes C3-4 through C5-6 incompletely assessed. Mastoid air  cells and middle ear cavities are clear. IMPRESSION: 1. Comminuted nasal bone fracture bilaterally with inward and slight leftward displacement of fracture fragments. Impaction fracture of the anterior nasal septum. Associated hemorrhage. 2. No other fracture noted. 3. Pansinus mucosal thickening/opacification as detailed above. 4. Cervical spondylotic changes C3-4 through C5-6 incompletely assessed. 5. Carotid bifurcation calcifications advanced for Logan Garrett's age. Electronically Signed   By: Lacy Duverney M.D.   On: 07/31/2018 15:39    Review of Systems  All other systems reviewed and are negative.  There were no vitals taken for this visit. Physical Exam  Constitutional: He appears well-developed and well-nourished.  HENT:  Head: Normocephalic.  Neck: Normal range of motion.  Cardiovascular: Normal rate.  Respiratory: Effort normal.  Musculoskeletal:       Left wrist: He exhibits tenderness, bony tenderness, swelling and deformity.  Left wrist pain, swelling, and deformity  Skin: Skin is warm.  Psychiatric: He has a normal mood and affect. His behavior is normal. Judgment and thought content normal.    Assessment/Plan: Logan Garrett is a very pleasant 47 year old male status post left wrist perilunate dislocation.  Have discussed the case with the [facility] emergency department and suggested closed reduction of this dislocation.  They are uncomfortable with this at the present time and therefore we will transfer the Logan Garrett to [facility] for surgical intervention and fixation of the left wrist perilunate dislocation.  Artist Pais [facility] 07/31/2018, 7:02 PM

## 2018-07-31 NOTE — Discharge Instructions (Addendum)
Go directly to short [facility] at [facility].  Nothing to eat or drink.  Dr. Mina MarbleWeingold will see you there.  Call Dr. Darnell LevelBillingham's office tomorrow morning to arrange follow-up regarding your nasal bone fractures

## 2018-07-31 NOTE — Anesthesia Postprocedure Evaluation (Signed)
Anesthesia Post Note  Patient: Logan Garrett  Procedure(s) Performed: OPEN REDUCTION INTERNAL FIXATION (ORIF) LEFT PERILUNATE DISLOCATION (Left )     Patient location during evaluation: PACU Anesthesia Type: General and Regional Level of consciousness: awake and alert Pain management: pain level controlled Vital Signs Assessment: post-procedure vital signs reviewed and stable Respiratory status: spontaneous breathing, nonlabored ventilation and respiratory function stable Cardiovascular status: blood pressure returned to baseline and stable Postop Assessment: no apparent nausea or vomiting Anesthetic complications: no    Last Vitals:  Vitals:   07/31/18 2205 07/31/18 2221  BP: (!) 157/86 (!) 155/95  Pulse: 88 86  Resp: 16 19  Temp:    SpO2: 95% 100%    Last Pain:  Vitals:   07/31/18 2151  PainSc: 0-No pain                 Logan Garrett,W. EDMOND

## 2018-07-31 NOTE — ED Notes (Addendum)
Per PA, Triplett, dr Mina Marbleweingold called back and will do surgery there tonight, for pt to go to short stay surgery center. Arm in sugar tong splint and sling at this time. Neuro intact. [facility]

## 2018-07-31 NOTE — Anesthesia Procedure Notes (Signed)
Procedure Name: Intubation Date/Time: 07/31/2018 7:59 PM Performed by: Claris Che, CRNA Pre-anesthesia Checklist: Patient identified, Emergency Drugs available, Suction available, Patient being monitored and Timeout performed Patient Re-evaluated:Patient Re-evaluated prior to induction Oxygen Delivery Method: Circle system utilized Preoxygenation: Pre-oxygenation with 100% oxygen Induction Type: IV induction, Rapid sequence and Cricoid Pressure applied Laryngoscope Size: Mac and 4 Grade View: Grade III Tube type: Oral Tube size: 7.5 mm Number of attempts: 1 Airway Equipment and Method: Stylet Placement Confirmation: ETT inserted through vocal cords under direct vision,  positive ETCO2 and breath sounds checked- equal and bilateral Secured at: 24 cm Tube secured with: Tape Dental Injury: Teeth and Oropharynx as per pre-operative assessment

## 2018-07-31 NOTE — Anesthesia Procedure Notes (Signed)
Anesthesia Regional Block: Supraclavicular block   Pre-Anesthetic Checklist: ,, timeout performed, Correct Patient, Correct Site, Correct Laterality, Correct Procedure, Correct Position, site marked, Risks and benefits discussed, pre-op evaluation,  At surgeon's request and post-op pain management  Laterality: Left  Prep: Maximum Sterile Barrier Precautions used, chloraprep       Needles:  Injection technique: Single-shot  Needle Type: Echogenic Stimulator Needle     Needle Length: 9cm  Needle Gauge: 21     Additional Needles:   Procedures:,,,, ultrasound used (permanent image in chart),,,,  Narrative:  Start time: 07/31/2018 7:25 PM End time: 07/31/2018 7:35 PM Injection made incrementally with aspirations every 5 mL. Anesthesiologist: Gaynelle AduFitzgerald, Macallister Ashmead, MD  Additional Notes: 2% Lidocaine skin wheel.

## 2018-07-31 NOTE — ED Notes (Signed)
Nose noted to be swollen with abraisons

## 2018-08-01 ENCOUNTER — Encounter (HOSPITAL_COMMUNITY): Payer: Self-pay | Admitting: Orthopedic Surgery

## 2018-08-01 ENCOUNTER — Ambulatory Visit: Payer: 59 | Admitting: Plastic Surgery

## 2018-08-01 VITALS — BP 135/85 | HR 99 | Resp 16 | Ht 76.0 in | Wt 240.0 lb

## 2018-08-01 DIAGNOSIS — S022XXA Fracture of nasal bones, initial encounter for closed fracture: Secondary | ICD-10-CM

## 2018-08-01 DIAGNOSIS — S022XXB Fracture of nasal bones, initial encounter for open fracture: Secondary | ICD-10-CM | POA: Diagnosis not present

## 2018-08-01 NOTE — Progress Notes (Signed)
   Patient ID: Massimiliano Wyche, male    DOB: 06/20/1971, 47 y.o.   MRN: [id number]   Chief Complaint  Patient presents with  . Facial Injury    The patient is a 47-year-old wm here with his wife for a emergency room follow-up.  He had a fall yesterday while working outside and fell on his face striking a piece of wood.  He also had a perilunate fracture of his left hand which was repaired last night.  He was seen in the emergency room for his hand and his face.  The CT of his face showed a comminuted nasal bone fracture bilaterally with inward and leftward displacement and an impaction fracture of the anterior nasal septum.  He states he has probably broken his nose in the past as well.  He has a postoperative splint on his left hand.  There is a laceration on the dorsum of his nose with significant swelling.   Review of Systems  Constitutional: Negative for appetite change.  HENT: Negative.   Eyes: Negative.   Respiratory: Negative.  Negative for chest tightness.   Gastrointestinal: Negative.   Endocrine: Negative.   Genitourinary: Negative.   Musculoskeletal: Negative.   Skin: Positive for color change and wound.  Neurological: Negative.   Psychiatric/Behavioral: Negative.     Past Medical History:  Diagnosis Date  . High cholesterol   . History of kidney stones   . Hyperlipidemia   . Hypertension     Past Surgical History:  Procedure Laterality Date  . ORIF WRIST FRACTURE Left 07/31/2018   Procedure: OPEN REDUCTION INTERNAL FIXATION (ORIF) LEFT PERILUNATE DISLOCATION;  Surgeon: Weingold, Matthew, MD;  Location: [facility] OR;  Service: Orthopedics;  Laterality: Left;      Current Outpatient Medications:  .  amoxicillin (AMOXIL) 500 MG capsule, Take 1,000 mg by mouth 2 (two) times daily., Disp: , Rfl:  .  ARIPiprazole (ABILIFY) 2 MG tablet, Take 1 tablet (2 mg total) by mouth daily., Disp: 90 tablet, Rfl: 0 .  loratadine (CLARITIN) 10 MG tablet, Take 10 mg by mouth daily., Disp:  , Rfl:  .  oxyCODONE-acetaminophen (PERCOCET) 5-325 MG tablet, Take 1 tablet by mouth every 4 (four) hours as needed for severe pain., Disp: 20 tablet, Rfl: 0 .  rosuvastatin (CRESTOR) 20 MG tablet, Take 20 mg by mouth daily., Disp: , Rfl:  .  rosuvastatin (CRESTOR) 20 MG tablet, Take 20 mg by mouth daily., Disp: , Rfl:  .  sertraline (ZOLOFT) 100 MG tablet, Take 100 mg by mouth daily., Disp: , Rfl:  .  sertraline (ZOLOFT) 100 MG tablet, Take 1 tablet (100 mg total) by mouth daily., Disp: 90 tablet, Rfl: 0 .  Tamsulosin HCl (FLOMAX) 0.4 MG CAPS, Take 1 capsule (0.4 mg total) by mouth daily., Disp: 30 capsule, Rfl: 0 .  Testosterone (ANDROGEL PUMP) 20.25 MG/ACT (1.62%) GEL, Place 81 mg onto the skin daily. 81mg=4 pumps, Disp: , Rfl:  .  valsartan-hydrochlorothiazide (DIOVAN-HCT) 80-12.5 MG per tablet, Take 1 tablet by mouth daily., Disp: , Rfl:  .  valsartan-hydrochlorothiazide (DIOVAN-HCT) 80-12.5 MG tablet, Take 1 tablet by mouth daily., Disp: , Rfl:    Objective:   Vitals:   08/01/18 1251  BP: 135/85  Pulse: 99  Resp: 16  SpO2: 97%    Physical Exam  Constitutional: He is oriented to person, place, and time. He appears well-developed and well-nourished.  HENT:  Head: Normocephalic.    Eyes: Pupils are equal, round, and reactive to light. EOM   are normal.  Cardiovascular: Normal rate.  Pulmonary/Chest: Effort normal.  Abdominal: Soft.  Neurological: He is alert and oriented to person, place, and time.  Skin: Skin is warm.  Psychiatric: He has a normal mood and affect. His behavior is normal. Judgment and thought content normal.    Assessment & Plan:  Open fracture of nasal bone, initial encounter  Closed fracture of nasal septum, initial encounter Recommend closed nasal fracture and septal fracture reduction with internal and external splinting next week.  Try not to blow your nose.  No heavy lifting.  And cold compresses as able to the area.  Jarriel Papillion S Ivis Henneman, DO 

## 2018-08-01 NOTE — H&P (View-Only) (Signed)
Patient ID: Logan Garrett, male    DOB: Aug 22, 1971, 47 y.o.   MRN: [id number]   Chief Complaint  Patient presents with  . Facial Injury    The patient is a 47 year old wm here with his wife for a emergency room follow-up.  He had a fall yesterday while working outside and fell on his face striking a piece of wood.  He also had a perilunate fracture of his left hand which was repaired last night.  He was seen in the emergency room for his hand and his face.  The CT of his face showed a comminuted nasal bone fracture bilaterally with inward and leftward displacement and an impaction fracture of the anterior nasal septum.  He states he has probably broken his nose in the past as well.  He has a postoperative splint on his left hand.  There is a laceration on the dorsum of his nose with significant swelling.   Review of Systems  Constitutional: Negative for appetite change.  HENT: Negative.   Eyes: Negative.   Respiratory: Negative.  Negative for chest tightness.   Gastrointestinal: Negative.   Endocrine: Negative.   Genitourinary: Negative.   Musculoskeletal: Negative.   Skin: Positive for color change and wound.  Neurological: Negative.   Psychiatric/Behavioral: Negative.     Past Medical History:  Diagnosis Date  . High cholesterol   . History of kidney stones   . Hyperlipidemia   . Hypertension     Past Surgical History:  Procedure Laterality Date  . ORIF WRIST FRACTURE Left 07/31/2018   Procedure: OPEN REDUCTION INTERNAL FIXATION (ORIF) LEFT PERILUNATE DISLOCATION;  Surgeon: Dairl Ponder, MD;  Location: [facility] OR;  Service: Orthopedics;  Laterality: Left;      Current Outpatient Medications:  .  amoxicillin (AMOXIL) 500 MG capsule, Take 1,000 mg by mouth 2 (two) times daily., Disp: , Rfl:  .  ARIPiprazole (ABILIFY) 2 MG tablet, Take 1 tablet (2 mg total) by mouth daily., Disp: 90 tablet, Rfl: 0 .  loratadine (CLARITIN) 10 MG tablet, Take 10 mg by mouth daily., Disp:  , Rfl:  .  oxyCODONE-acetaminophen (PERCOCET) 5-325 MG tablet, Take 1 tablet by mouth every 4 (four) hours as needed for severe pain., Disp: 20 tablet, Rfl: 0 .  rosuvastatin (CRESTOR) 20 MG tablet, Take 20 mg by mouth daily., Disp: , Rfl:  .  rosuvastatin (CRESTOR) 20 MG tablet, Take 20 mg by mouth daily., Disp: , Rfl:  .  sertraline (ZOLOFT) 100 MG tablet, Take 100 mg by mouth daily., Disp: , Rfl:  .  sertraline (ZOLOFT) 100 MG tablet, Take 1 tablet (100 mg total) by mouth daily., Disp: 90 tablet, Rfl: 0 .  Tamsulosin HCl (FLOMAX) 0.4 MG CAPS, Take 1 capsule (0.4 mg total) by mouth daily., Disp: 30 capsule, Rfl: 0 .  Testosterone (ANDROGEL PUMP) 20.25 MG/ACT (1.62%) GEL, Place 81 mg onto the skin daily. 81mg =4 pumps, Disp: , Rfl:  .  valsartan-hydrochlorothiazide (DIOVAN-HCT) 80-12.5 MG per tablet, Take 1 tablet by mouth daily., Disp: , Rfl:  .  valsartan-hydrochlorothiazide (DIOVAN-HCT) 80-12.5 MG tablet, Take 1 tablet by mouth daily., Disp: , Rfl:    Objective:   Vitals:   08/01/18 1251  BP: 135/85  Pulse: 99  Resp: 16  SpO2: 97%    Physical Exam  Constitutional: He is oriented to person, place, and time. He appears well-developed and well-nourished.  HENT:  Head: Normocephalic.    Eyes: Pupils are equal, round, and reactive to light. EOM  are normal.  Cardiovascular: Normal rate.  Pulmonary/Chest: Effort normal.  Abdominal: Soft.  Neurological: He is alert and oriented to person, place, and time.  Skin: Skin is warm.  Psychiatric: He has a normal mood and affect. His behavior is normal. Judgment and thought content normal.    Assessment & Plan:  Open fracture of nasal bone, initial encounter  Closed fracture of nasal septum, initial encounter Recommend closed nasal fracture and septal fracture reduction with internal and external splinting next week.  Try not to blow your nose.  No heavy lifting.  And cold compresses as able to the area.  Alena Billslaire S Janeli Lewison, DO

## 2018-08-01 NOTE — Op Note (Signed)
NAMThalia Garrett: [name], [name] MEDICAL RECORD [id number] ACCOUNT [id number] DATE OF BIRTH:07/07/1971 FACILITY: [facility] LOCATION: [facility] PHYSICIAN:Shyra Emile A. Mina MarbleWEINGOLD, MD  OPERATIVE REPORT  DATE OF PROCEDURE:  07/31/2018  PREOPERATIVE DIAGNOSIS:  Acute left wrist perilunate dislocation with acute carpal tunnel syndrome.  POSTOPERATIVE DIAGNOSIS:  Acute left wrist perilunate dislocation with acute carpal tunnel syndrome.  PROCEDURE:  Median nerve decompression, left wrist with open treatment of left wrist perilunate dislocation with both dorsal and volar approaches and pinning of scapholunate, scaphocapitate, and lunotriquetral spaces.  SURGEON:  Dairl PonderMatthew Celia Gibbons, MD  ASSISTANT:  None.  ANESTHESIA:  Axillary block and general.  COMPLICATIONS:  No complications.  DRAINS:  No drains.  DESCRIPTION OF PROCEDURE:  The patient was taken to the operating suite after induction of adequate general anesthetic and regional anesthetic.  The left upper extremity was prepped and draped in the usual sterile fashion.  An Esmarch was used to  exsanguinate the limb and the tourniquet was inflated to 250 mmHg.  At this point in time, incision was made on the palmar aspect of the left hand and wrist and we dissected across the thenar eminence increase in [place]runer fashion and then parallel in the  palmaris longus tendon.  Dissection was carried down to the palmaris longus tendon, which was carefully retracted to the radial side.  The median nerve was identified in the distal forearm and decompressing the level of the carpal canal.  There was  significant blood in the carpal canal and we carefully retracted the median nerve and contents of the carpal canal to the ulnar side.  There was an obvious lunate dislocation into the carpal canal, which was carefully reduced back into the mid carpal  row.  We then fixed the large tear in the volar capsule with 0 Ethibond suture.  We then irrigated and loosely closed this  incision with 3-0 undyed Vicryl and 4-0 nylon on the skin.  We then turned the hand in a fully pronated position and made a dorsal  midline incision and dissected down between the second and fourth dorsal compartments.  There was a complete disruption of the scapholunate interosseous ligament evident dorsally.  We identified the EPL tendon and retracted it.  We then reduced the  scaphoid and under direct and fluoroscopic guidance, placed an 0.062 K-wire from the scaphoid into the lunate to maintain the reduction in both AP and lateral plane.  We did this through a small incision on the radial aspect just distal to the tip of the  radial styloid.  A second 0.062 K-wire was then placed from the distal pole of the scaphoid into the capitate and finally a third one through a stab wound from the triquetrum into the lunate.  Fluoroscopic imaging revealed adequate reduction and good  placement of the hardware.  We then repaired the dorsal aspect of the scapholunate ligament with the 2-0 Ethibond suture and then closed the dorsal capsule with the 0 Ethibond as well.  Fluoroscopic imaging revealed good placement of the K-wires and  reduction of the scaphoid and the lunate in a neutral position.  We reapproximated the extensor retinaculum with 4-0 Vicryl and the skin dorsally with 4-0 nylon as well as well including the small radial-sided incision for the K-wire placement.  We then  dressed with Xeroform, 4 x 4's, and a compression bandage and dorsal and volar splints.  The patient tolerated these procedures well and went to recovery room in stable fashion.  TN/NUANCE  D:07/31/2018 T:08/01/2018 JOB:[id number]

## 2018-08-02 ENCOUNTER — Other Ambulatory Visit: Payer: Self-pay

## 2018-08-02 ENCOUNTER — Encounter (HOSPITAL_BASED_OUTPATIENT_CLINIC_OR_DEPARTMENT_OTHER): Payer: Self-pay | Admitting: *Deleted

## 2018-08-08 ENCOUNTER — Encounter (HOSPITAL_BASED_OUTPATIENT_CLINIC_OR_DEPARTMENT_OTHER)
Admission: RE | Admit: 2018-08-08 | Discharge: 2018-08-08 | Disposition: A | Discharge status: Home / Self Care | Payer: 59 | Source: Ambulatory Visit | Attending: Plastic Surgery | Admitting: Plastic Surgery

## 2018-08-08 DIAGNOSIS — Z01818 Encounter for other preprocedural examination: Secondary | ICD-10-CM

## 2018-08-08 DIAGNOSIS — X58XXXA Exposure to other specified factors, initial encounter: Secondary | ICD-10-CM

## 2018-08-08 DIAGNOSIS — S022XXA Fracture of nasal bones, initial encounter for closed fracture: Secondary | ICD-10-CM | POA: Insufficient documentation

## 2018-08-08 DIAGNOSIS — Z79899 Other long term (current) drug therapy: Secondary | ICD-10-CM | POA: Diagnosis not present

## 2018-08-08 DIAGNOSIS — W1809XA Striking against other object with subsequent fall, initial encounter: Secondary | ICD-10-CM | POA: Diagnosis not present

## 2018-08-08 DIAGNOSIS — I10 Essential (primary) hypertension: Secondary | ICD-10-CM | POA: Insufficient documentation

## 2018-08-08 DIAGNOSIS — E785 Hyperlipidemia, unspecified: Secondary | ICD-10-CM | POA: Diagnosis not present

## 2018-08-08 DIAGNOSIS — E7800 Pure hypercholesterolemia, unspecified: Secondary | ICD-10-CM | POA: Diagnosis not present

## 2018-08-08 LAB — BASIC METABOLIC PANEL
ANION GAP: 11 (ref 5–15)
BUN: 13 mg/dL (ref 6–20)
CALCIUM: 9.7 mg/dL (ref 8.9–10.3)
CO2: 24 mmol/L (ref 22–32)
Chloride: 102 mmol/L (ref 98–111)
Creatinine, Ser: 1 mg/dL (ref 0.61–1.24)
GFR calc Af Amer: >60 mL/min (ref >60)
GLUCOSE: 89 mg/dL (ref 70–99)
Potassium: 4 mmol/L (ref 3.5–5.1)
SODIUM: 137 mmol/L (ref 135–145)

## 2018-08-10 ENCOUNTER — Encounter (HOSPITAL_BASED_OUTPATIENT_CLINIC_OR_DEPARTMENT_OTHER): Payer: Self-pay

## 2018-08-10 ENCOUNTER — Ambulatory Visit (HOSPITAL_BASED_OUTPATIENT_CLINIC_OR_DEPARTMENT_OTHER)
Admission: RE | Admit: 2018-08-10 | Discharge: 2018-08-10 | Disposition: A | Discharge status: Home / Self Care | Payer: 59 | Source: Ambulatory Visit | Attending: Plastic Surgery | Admitting: Plastic Surgery

## 2018-08-10 ENCOUNTER — Encounter (HOSPITAL_BASED_OUTPATIENT_CLINIC_OR_DEPARTMENT_OTHER)
Admission: RE | Disposition: A | Discharge status: Home / Self Care | Payer: Self-pay | Source: Ambulatory Visit | Attending: Plastic Surgery

## 2018-08-10 ENCOUNTER — Other Ambulatory Visit: Payer: Self-pay

## 2018-08-10 ENCOUNTER — Ambulatory Visit (HOSPITAL_BASED_OUTPATIENT_CLINIC_OR_DEPARTMENT_OTHER): Payer: 59 | Admitting: Anesthesiology

## 2018-08-10 DIAGNOSIS — Z79899 Other long term (current) drug therapy: Secondary | ICD-10-CM | POA: Insufficient documentation

## 2018-08-10 DIAGNOSIS — E785 Hyperlipidemia, unspecified: Secondary | ICD-10-CM | POA: Insufficient documentation

## 2018-08-10 DIAGNOSIS — I10 Essential (primary) hypertension: Secondary | ICD-10-CM | POA: Insufficient documentation

## 2018-08-10 DIAGNOSIS — S022XXA Fracture of nasal bones, initial encounter for closed fracture: Secondary | ICD-10-CM | POA: Insufficient documentation

## 2018-08-10 DIAGNOSIS — W1809XA Striking against other object with subsequent fall, initial encounter: Secondary | ICD-10-CM | POA: Insufficient documentation

## 2018-08-10 DIAGNOSIS — E7800 Pure hypercholesterolemia, unspecified: Secondary | ICD-10-CM | POA: Insufficient documentation

## 2018-08-10 HISTORY — DX: Depression, unspecified: F32.A

## 2018-08-10 HISTORY — DX: Major depressive disorder, single episode, unspecified: F32.9

## 2018-08-10 HISTORY — PX: CLOSED REDUCTION NASAL FRACTURE: SHX5365

## 2018-08-10 SURGERY — CLOSED REDUCTION, FRACTURE, NASAL BONE
Anesthesia: General | Site: Nose | Laterality: Bilateral

## 2018-08-10 MED ORDER — ONDANSETRON HCL 4 MG/2ML IJ SOLN
INTRAMUSCULAR | Status: AC
  Filled 2018-08-10: qty 4

## 2018-08-10 MED ORDER — LABETALOL HCL 5 MG/ML IV SOLN
5.0000 mg | Freq: Once | INTRAVENOUS | Status: AC
  Administered 2018-08-10: 5 mg via INTRAVENOUS

## 2018-08-10 MED ORDER — FENTANYL CITRATE (PF) 100 MCG/2ML IJ SOLN
50.0000 ug | INTRAMUSCULAR | Status: DC | PRN
  Administered 2018-08-10: 100 ug via INTRAVENOUS

## 2018-08-10 MED ORDER — OXYCODONE HCL 5 MG PO TABS: 5.0000 mg | ORAL_TABLET | Freq: Once | ORAL | Status: DC | PRN

## 2018-08-10 MED ORDER — LABETALOL HCL 5 MG/ML IV SOLN
INTRAVENOUS | Status: AC
  Filled 2018-08-10: qty 4

## 2018-08-10 MED ORDER — OXYMETAZOLINE HCL 0.05 % NA SOLN
NASAL | Status: AC
  Filled 2018-08-10: qty 15

## 2018-08-10 MED ORDER — SODIUM CHLORIDE 0.9% FLUSH: 3.0000 mL | Freq: Two times a day (BID) | INTRAVENOUS | Status: DC

## 2018-08-10 MED ORDER — BACITRACIN ZINC 500 UNIT/GM EX OINT
TOPICAL_OINTMENT | CUTANEOUS | Status: AC
  Filled 2018-08-10: qty 28.35

## 2018-08-10 MED ORDER — ACETAMINOPHEN 650 MG RE SUPP: 650.0000 mg | RECTAL | Status: DC | PRN

## 2018-08-10 MED ORDER — BACITRACIN ZINC 500 UNIT/GM EX OINT
TOPICAL_OINTMENT | CUTANEOUS | Status: DC | PRN
  Administered 2018-08-10: 1 via TOPICAL

## 2018-08-10 MED ORDER — FENTANYL CITRATE (PF) 100 MCG/2ML IJ SOLN
25.0000 ug | INTRAMUSCULAR | Status: DC | PRN
  Administered 2018-08-10: 50 ug via INTRAVENOUS

## 2018-08-10 MED ORDER — PROPOFOL 10 MG/ML IV BOLUS
INTRAVENOUS | Status: DC | PRN
  Administered 2018-08-10: 200 mg via INTRAVENOUS

## 2018-08-10 MED ORDER — MUPIROCIN CALCIUM 2 % EX CREA
TOPICAL_CREAM | CUTANEOUS | Status: AC
  Filled 2018-08-10: qty 15

## 2018-08-10 MED ORDER — HYDROCODONE-ACETAMINOPHEN 5-325 MG PO TABS: 2.0000 | ORAL_TABLET | Freq: Four times a day (QID) | ORAL | 0 refills | Status: AC | PRN

## 2018-08-10 MED ORDER — FENTANYL CITRATE (PF) 100 MCG/2ML IJ SOLN
INTRAMUSCULAR | Status: AC
  Filled 2018-08-10: qty 2

## 2018-08-10 MED ORDER — MEPERIDINE HCL 25 MG/ML IJ SOLN: 6.2500 mg | INTRAMUSCULAR | Status: DC | PRN

## 2018-08-10 MED ORDER — DEXAMETHASONE SODIUM PHOSPHATE 10 MG/ML IJ SOLN
INTRAMUSCULAR | Status: AC
  Filled 2018-08-10: qty 1

## 2018-08-10 MED ORDER — ACETAMINOPHEN 160 MG/5ML PO SOLN: 325.0000 mg | ORAL | Status: DC | PRN

## 2018-08-10 MED ORDER — LIDOCAINE 2% (20 MG/ML) 5 ML SYRINGE
INTRAMUSCULAR | Status: AC
  Filled 2018-08-10: qty 5

## 2018-08-10 MED ORDER — DEXAMETHASONE SODIUM PHOSPHATE 4 MG/ML IJ SOLN
INTRAMUSCULAR | Status: DC | PRN
  Administered 2018-08-10: 10 mg via INTRAVENOUS

## 2018-08-10 MED ORDER — ACETAMINOPHEN 325 MG PO TABS: 325.0000 mg | ORAL_TABLET | ORAL | Status: DC | PRN

## 2018-08-10 MED ORDER — SODIUM CHLORIDE 0.9 % IV SOLN: 250.0000 mL | INTRAVENOUS | Status: DC | PRN

## 2018-08-10 MED ORDER — OXYMETAZOLINE HCL 0.05 % NA SOLN
NASAL | Status: DC | PRN
  Administered 2018-08-10: 1 via TOPICAL

## 2018-08-10 MED ORDER — ACETAMINOPHEN 325 MG PO TABS: 650.0000 mg | ORAL_TABLET | ORAL | Status: DC | PRN

## 2018-08-10 MED ORDER — MIDAZOLAM HCL 2 MG/2ML IJ SOLN
INTRAMUSCULAR | Status: AC
  Filled 2018-08-10: qty 2

## 2018-08-10 MED ORDER — MIDAZOLAM HCL 2 MG/2ML IJ SOLN
1.0000 mg | INTRAMUSCULAR | Status: DC | PRN
  Administered 2018-08-10: 2 mg via INTRAVENOUS

## 2018-08-10 MED ORDER — OXYCODONE HCL 5 MG PO TABS: 5.0000 mg | ORAL_TABLET | ORAL | Status: DC | PRN

## 2018-08-10 MED ORDER — AMOXICILLIN-POT CLAVULANATE 875-125 MG PO TABS: 1.0000 | ORAL_TABLET | Freq: Two times a day (BID) | ORAL | 0 refills | Status: AC

## 2018-08-10 MED ORDER — LIDOCAINE-EPINEPHRINE 1 %-1:100000 IJ SOLN
INTRAMUSCULAR | Status: DC | PRN
  Administered 2018-08-10: 1 mL

## 2018-08-10 MED ORDER — SCOPOLAMINE 1 MG/3DAYS TD PT72: 1.0000 | MEDICATED_PATCH | Freq: Once | TRANSDERMAL | Status: DC | PRN

## 2018-08-10 MED ORDER — CEFAZOLIN SODIUM-DEXTROSE 2-4 GM/100ML-% IV SOLN
INTRAVENOUS | Status: AC
  Filled 2018-08-10: qty 100

## 2018-08-10 MED ORDER — PROPOFOL 10 MG/ML IV BOLUS
INTRAVENOUS | Status: AC
  Filled 2018-08-10: qty 20

## 2018-08-10 MED ORDER — LACTATED RINGERS IV SOLN
INTRAVENOUS | Status: DC
  Administered 2018-08-10 (×2): via INTRAVENOUS

## 2018-08-10 MED ORDER — LIDOCAINE HCL (CARDIAC) PF 100 MG/5ML IV SOSY
PREFILLED_SYRINGE | INTRAVENOUS | Status: DC | PRN
  Administered 2018-08-10: 100 mg via INTRAVENOUS

## 2018-08-10 MED ORDER — BACITRACIN-POLYMYXIN B 500-10000 UNIT/GM OP OINT
TOPICAL_OINTMENT | OPHTHALMIC | Status: AC
  Filled 2018-08-10: qty 3.5

## 2018-08-10 MED ORDER — CEFAZOLIN SODIUM-DEXTROSE 2-4 GM/100ML-% IV SOLN: 2.0000 g | INTRAVENOUS | Status: DC

## 2018-08-10 MED ORDER — ONDANSETRON HCL 4 MG/2ML IJ SOLN: 4.0000 mg | Freq: Once | INTRAMUSCULAR | Status: DC | PRN

## 2018-08-10 MED ORDER — ONDANSETRON HCL 4 MG/2ML IJ SOLN
INTRAMUSCULAR | Status: DC | PRN
  Administered 2018-08-10: 4 mg via INTRAVENOUS

## 2018-08-10 MED ORDER — SODIUM CHLORIDE 0.9% FLUSH: 3.0000 mL | INTRAVENOUS | Status: DC | PRN

## 2018-08-10 MED ORDER — OXYCODONE HCL 5 MG/5ML PO SOLN: 5.0000 mg | Freq: Once | ORAL | Status: DC | PRN

## 2018-08-10 MED ORDER — EPHEDRINE SULFATE 50 MG/ML IJ SOLN
INTRAMUSCULAR | Status: DC | PRN
  Administered 2018-08-10: 10 mg via INTRAVENOUS

## 2018-08-10 SURGICAL SUPPLY — 50 items
APPLICATOR COTTON TIP 6 STRL (MISCELLANEOUS) ×1 IMPLANT
APPLICATOR COTTON TIP 6IN STRL (MISCELLANEOUS) ×3
BENZOIN TINCTURE PRP APPL 2/3 (GAUZE/BANDAGES/DRESSINGS) IMPLANT
BLADE SURG 15 STRL LF DISP TIS (BLADE) IMPLANT
BLADE SURG 15 STRL SS (BLADE)
CANISTER SUCT 1200ML W/VALVE (MISCELLANEOUS) ×3 IMPLANT
CLOSURE WOUND 1/2 X4 (GAUZE/BANDAGES/DRESSINGS) ×1
COVER BACK TABLE 60X90IN (DRAPES) ×3 IMPLANT
COVER MAYO STAND STRL (DRAPES) ×3 IMPLANT
COVER WAND RF STERILE (DRAPES) IMPLANT
DECANTER SPIKE VIAL GLASS SM (MISCELLANEOUS) IMPLANT
DERMABOND ADVANCED (GAUZE/BANDAGES/DRESSINGS)
DERMABOND ADVANCED .7 DNX12 (GAUZE/BANDAGES/DRESSINGS) IMPLANT
DRSG NASOPORE 8CM (GAUZE/BANDAGES/DRESSINGS) IMPLANT
ELECT NEEDLE BLADE 2-5/6 (NEEDLE) IMPLANT
ELECT REM PT RETURN 9FT ADLT (ELECTROSURGICAL) ×3
ELECTRODE REM PT RTRN 9FT ADLT (ELECTROSURGICAL) ×1 IMPLANT
GAUZE VASELINE FOILPK 1/2 X 72 (GAUZE/BANDAGES/DRESSINGS) IMPLANT
GLOVE BIO SURGEON STRL SZ 6.5 (GLOVE) ×2 IMPLANT
GLOVE BIO SURGEONS STRL SZ 6.5 (GLOVE) ×1
GLOVE BIOGEL M STRL SZ7.5 (GLOVE) ×3 IMPLANT
GLOVE BIOGEL PI IND STRL 8 (GLOVE) ×1 IMPLANT
GLOVE BIOGEL PI INDICATOR 8 (GLOVE) ×2
GOWN STRL REUS W/ TWL LRG LVL3 (GOWN DISPOSABLE) ×2 IMPLANT
GOWN STRL REUS W/TWL LRG LVL3 (GOWN DISPOSABLE) ×4
KIT SPLINT NASAL DENVER LRG BE (GAUZE/BANDAGES/DRESSINGS) IMPLANT
KIT SPLINT NASAL DENVER MIN BE (GAUZE/BANDAGES/DRESSINGS) IMPLANT
KIT SPLINT NASAL DENVER PET BE (GAUZE/BANDAGES/DRESSINGS) IMPLANT
KIT SPLINT NASAL DENVER SM BEI (GAUZE/BANDAGES/DRESSINGS) ×3 IMPLANT
NEEDLE PRECISIONGLIDE 27X1.5 (NEEDLE) ×3 IMPLANT
PACK BASIN DAY SURGERY FS (CUSTOM PROCEDURE TRAY) ×3 IMPLANT
PATTIES SURGICAL .5 X3 (DISPOSABLE) ×3 IMPLANT
PENCIL BUTTON HOLSTER BLD 10FT (ELECTRODE) IMPLANT
SHEET MEDIUM DRAPE 40X70 STRL (DRAPES) ×3 IMPLANT
SPLINT NASAL AIRWAY SILICONE (MISCELLANEOUS) ×3 IMPLANT
SPLINT NASAL THERMO PLAST (MISCELLANEOUS) IMPLANT
SPONGE GAUZE 2X2 8PLY STER LF (GAUZE/BANDAGES/DRESSINGS) ×1
SPONGE GAUZE 2X2 8PLY STRL LF (GAUZE/BANDAGES/DRESSINGS) ×2 IMPLANT
STRIP CLOSURE SKIN 1/2X4 (GAUZE/BANDAGES/DRESSINGS) ×2 IMPLANT
SUT CHROMIC 6 0 PS 4 (SUTURE) IMPLANT
SUT ETHILON 3 0 PS 1 (SUTURE) IMPLANT
SUT SILK 2 0 PERMA HAND 18 BK (SUTURE) IMPLANT
SUT SILK 3 0 SH 30 (SUTURE) ×3 IMPLANT
SYR CONTROL 10ML LL (SYRINGE) ×3 IMPLANT
TOWEL GREEN STERILE FF (TOWEL DISPOSABLE) ×3 IMPLANT
TRAY DSU PREP LF (CUSTOM PROCEDURE TRAY) ×3 IMPLANT
TUBE CONNECTING 20'X1/4 (TUBING) ×1
TUBE CONNECTING 20X1/4 (TUBING) ×2 IMPLANT
TUBE SALEM SUMP 16 FR W/ARV (TUBING) ×3 IMPLANT
YANKAUER SUCT BULB TIP NO VENT (SUCTIONS) IMPLANT

## 2018-08-10 NOTE — Interval H&P Note (Signed)
History and Physical Interval Note:  08/10/2018 5:59 PM  Logan Garrett  has presented today for surgery, with the diagnosis of open fracture of nasal bone  The various methods of treatment have been discussed with the patient and family. After consideration of risks, benefits and other options for treatment, the patient has consented to  Procedure(s): Closed nasal fracture and septal fracture reduction  Internal and external splinting (N/A) as a surgical intervention .  The patient's history has been reviewed, patient examined, no change in status, stable for surgery.  I have reviewed the patient's chart and labs.  Questions were answered to the patient's satisfaction.     Alena Billslaire S Dillingham

## 2018-08-10 NOTE — Anesthesia Preprocedure Evaluation (Signed)
Anesthesia Evaluation  Patient identified by MRN, date of birth, ID band Patient awake    Reviewed: Allergy & Precautions, H&P , NPO status , Patient's Chart, lab work & pertinent test results  Airway Mallampati: III  TM Distance: >3 FB Neck ROM: Full    Dental no notable dental hx. (+) Teeth Intact, Dental Advisory Given   Pulmonary neg pulmonary ROS,    Pulmonary exam normal breath sounds clear to auscultation       Cardiovascular hypertension, Pt. on medications  Rhythm:Regular Rate:Normal     Neuro/Psych negative neurological ROS  negative psych ROS   GI/Hepatic negative GI ROS, Neg liver ROS,   Endo/Other  negative endocrine ROS  Renal/GU negative Renal ROS  negative genitourinary   Musculoskeletal   Abdominal   Peds  Hematology negative hematology ROS (+)   Anesthesia Other Findings   Reproductive/Obstetrics negative OB ROS                             Anesthesia Physical  Anesthesia Plan  ASA: II  Anesthesia Plan: General   Post-op Pain Management:  Regional for Post-op pain   Induction: Intravenous  PONV Risk Score and Plan: 3 and Ondansetron, Dexamethasone and Midazolam  Airway Management Planned: Oral ETT  Additional Equipment:   Intra-op Plan:   Post-operative Plan: Extubation in OR  Informed Consent: I have reviewed the patients History and Physical, chart, labs and discussed the procedure including the risks, benefits and alternatives for the proposed anesthesia with the patient or authorized representative who has indicated his/her understanding and acceptance.   Dental advisory given  Plan Discussed with: CRNA, Anesthesiologist and Surgeon  Anesthesia Plan Comments:         Anesthesia Quick Evaluation

## 2018-08-10 NOTE — Op Note (Signed)
Operative Note   DATE OF OPERATION: 08/10/2018  [facility]   SURGICAL DIVISION: Plastic Surgery  PREOPERATIVE DIAGNOSES:  Nasal fracture  POSTOPERATIVE DIAGNOSES:  same  PROCEDURE:  Closed Nasal fracture reduction  SURGEON: Cillian Gwinner Sanger Yaxiel Minnie, DO  ANESTHESIA:  General.   COMPLICATIONS: None.   INDICATIONS FOR PROCEDURE:  The patient, Logan Garrett is a 47 y.o. male born on [birth year], is here for treatment of a traumatic fracture to his nose. MRN: [id number]  CONSENT:  Informed consent was obtained directly from the patient. Risks, benefits and alternatives were fully discussed. Specific risks including but not limited to bleeding, infection, hematoma, seroma, scarring, pain, infection, contracture, asymmetry, wound healing problems, and need for further surgery were all discussed. The patient did have an ample opportunity to have questions answered to satisfaction.   DESCRIPTION OF PROCEDURE:   The patient was taken to the operating room. SCDs were placed and IV antibiotics were given. The patient's operative site was prepped and draped in a sterile fashion. A time out was performed and all information was confirmed to be correct.  General anesthesia was administered.  The afrin soaked pledgets were placed in the nose.  The septum was injected with local.  After waiting several minutes for the vasoconstriction the speculum was placed in the nose and the septum was realigned to the right.  The nasal fracture had a left sided nasal wall broken laterally.  This was reset to the proper location.  The septal splints were placed and secured with a 4-0 Nylon.   The nasal nasal skin was cleaned and the steri strips were placed.  The posterior pharynx was suctioned to remove the blood.  The patient tolerated the procedure well.  There were no complications. The patient was allowed to wake from anesthesia, extubated and taken to the recovery room in  satisfactory condition.

## 2018-08-10 NOTE — Discharge Instructions (Signed)
Don't blow your nose. No heavy lifting. Leave splint in place. Head of bed elevated as able.  Post Anesthesia Home Care Instructions  Activity: Get plenty of rest for the remainder of the day. A responsible individual must stay with you for 24 hours following the procedure.  For the next 24 hours, DO NOT: -Drive a car -Advertising copywriterperate machinery -Drink alcoholic beverages -Take any medication unless instructed by your physician -Make any legal decisions or sign important papers.  Meals: Start with liquid foods such as gelatin or soup. Progress to regular foods as tolerated. Avoid greasy, spicy, heavy foods. If nausea and/or vomiting occur, drink only clear liquids until the nausea and/or vomiting subsides. Call your physician if vomiting continues.  Special Instructions/Symptoms: Your throat may feel dry or sore from the anesthesia or the breathing tube placed in your throat during surgery. If this causes discomfort, gargle with warm salt water. The discomfort should disappear within 24 hours.  If you had a scopolamine patch placed behind your ear for the management of post- operative nausea and/or vomiting:  1. The medication in the patch is effective for 72 hours, after which it should be removed.  Wrap patch in a tissue and discard in the trash. Wash hands thoroughly with soap and water. 2. You may remove the patch earlier than 72 hours if you experience unpleasant side effects which may include dry mouth, dizziness or visual disturbances. 3. Avoid touching the patch. Wash your hands with soap and water after contact with the patch.

## 2018-08-10 NOTE — Anesthesia Procedure Notes (Signed)
Procedure Name: LMA Insertion Date/Time: 08/10/2018 6:21 PM Performed by: Gar GibbonKeeton, Miley Lindon S, CRNA Pre-anesthesia Checklist: Patient identified, Emergency Drugs available, Suction available and Patient being monitored Patient Re-evaluated:Patient Re-evaluated prior to induction Oxygen Delivery Method: Circle system utilized Preoxygenation: Pre-oxygenation with 100% oxygen Induction Type: IV induction Ventilation: Mask ventilation without difficulty LMA: LMA inserted LMA Size: 5.0 Number of attempts: 1 Airway Equipment and Method: Bite block Placement Confirmation: positive ETCO2 Tube secured with: Tape Dental Injury: Teeth and Oropharynx as per pre-operative assessment

## 2018-08-10 NOTE — Addendum Note (Signed)
Addended by: Peggye FormILLINGHAM, CLAIRE S on: 08/10/2018 06:02 PM   Modules accepted: Orders

## 2018-08-10 NOTE — Progress Notes (Signed)
Two doses of labetalol 5mg  IV given to patient. BP still continued to be elevated. Dr. Michelle Piperssey notified. Patient stated, "I'm walking out of the door right now". MD is aware and ok to discharge. Logan Garrett, Logan Garrett

## 2018-08-10 NOTE — Transfer of Care (Signed)
Immediate Anesthesia Transfer of Care Note  Patient: Logan BloodgoodDouglas Soter  Procedure(s) Performed: Closed nasal fracture and septal fracture reduction  Internal and external splinting (Bilateral Nose)  Patient Location: PACU  Anesthesia Type:General  Level of Consciousness: awake, sedated and patient cooperative  Airway & Oxygen Therapy: Patient Spontanous Breathing and aerosol face mask  Post-op Assessment: Report given to RN and Post -op Vital signs reviewed and stable  Post vital signs: Reviewed and stable  Last Vitals:  Vitals Value Taken Time  BP    Temp    Pulse 88 08/10/2018  6:51 PM  Resp    SpO2 100 % 08/10/2018  6:51 PM  Vitals shown include unvalidated device data.  Last Pain:  Vitals:   08/10/18 1313  TempSrc: Oral  PainSc: 0-No pain         Complications: No apparent anesthesia complications

## 2018-08-11 ENCOUNTER — Encounter (HOSPITAL_BASED_OUTPATIENT_CLINIC_OR_DEPARTMENT_OTHER): Payer: Self-pay | Admitting: Plastic Surgery

## 2018-08-13 NOTE — Anesthesia Postprocedure Evaluation (Signed)
Anesthesia Post Note  Patient: Logan BloodgoodDouglas Garrett  Procedure(s) Performed: Closed nasal fracture and septal fracture reduction  Internal and external splinting (Bilateral Nose)     Patient location during evaluation: PACU Anesthesia Type: General Level of consciousness: awake and alert Pain management: pain level controlled Vital Signs Assessment: post-procedure vital signs reviewed and stable Respiratory status: spontaneous breathing, nonlabored ventilation, respiratory function stable and patient connected to nasal cannula oxygen Cardiovascular status: blood pressure returned to baseline and stable Postop Assessment: no apparent nausea or vomiting Anesthetic complications: no    Last Vitals:  Vitals:   08/10/18 2006 08/10/18 2030  BP: (!) 165/99 (!) 170/102  Pulse: 74   Resp: 16   Temp: 36.9 C   SpO2: 98%     Last Pain:  Vitals:   08/10/18 2006  TempSrc:   PainSc: 3                  Neilah Fulwider DAVID

## 2018-08-15 ENCOUNTER — Encounter: Payer: Self-pay | Admitting: Plastic Surgery

## 2018-08-15 ENCOUNTER — Ambulatory Visit (INDEPENDENT_AMBULATORY_CARE_PROVIDER_SITE_OTHER): Payer: 59 | Admitting: Plastic Surgery

## 2018-08-15 DIAGNOSIS — S022XXA Fracture of nasal bones, initial encounter for closed fracture: Secondary | ICD-10-CM

## 2018-08-15 NOTE — Progress Notes (Signed)
   Subjective:    Patient ID: Logan Garrett, male    DOB: 04/16/1971, 47 y.o.   MRN: [id number]  Logan Garrett is a 47 year old male here for follow-up after closed reduction nasal septal fracture.  The external splint is off but the splints are still in place.  He still has swelling of the nose but it has improved from preop.  He has done well over the past week.  No sign of hematoma or infection. His left arm splint is in place.   Review of Systems  Constitutional: Negative.   HENT: Negative.   Eyes: Negative.   Respiratory: Negative.   Gastrointestinal: Negative.   Genitourinary: Negative.   Musculoskeletal: Negative.   Skin: Negative.   Neurological: Negative.        Objective:   Physical Exam  Constitutional: He appears well-developed and well-nourished.  HENT:  Head: Normocephalic.  Cardiovascular: Normal rate.  Pulmonary/Chest: Effort normal.  Neurological: He is alert.  Skin: Skin is warm.  Psychiatric: He has a normal mood and affect. His behavior is normal.       Assessment & Plan:  Closed fracture of nasal bone, initial encounter The internal splints were removed.  The external splint was applied.  I would like to see him back in two weeks.

## 2018-08-18 ENCOUNTER — Encounter: Payer: Self-pay | Admitting: Plastic Surgery

## 2019-01-08 ENCOUNTER — Ambulatory Visit (HOSPITAL_COMMUNITY): Payer: 59 | Admitting: Psychiatry

## 2019-01-12 ENCOUNTER — Ambulatory Visit (HOSPITAL_COMMUNITY): Payer: 59 | Admitting: Psychiatry

## 2019-01-24 ENCOUNTER — Telehealth (HOSPITAL_COMMUNITY): Payer: Self-pay | Admitting: Psychiatry

## 2020-01-24 IMAGING — DX DG WRIST COMPLETE 3+V*L*
4 series · 4 of 4 positions shown · non-contrast
Comparison: None.

CLINICAL DATA: Pain following fall

EXAM:
LEFT WRIST - COMPLETE 3+ VIEW

[wrist pa]
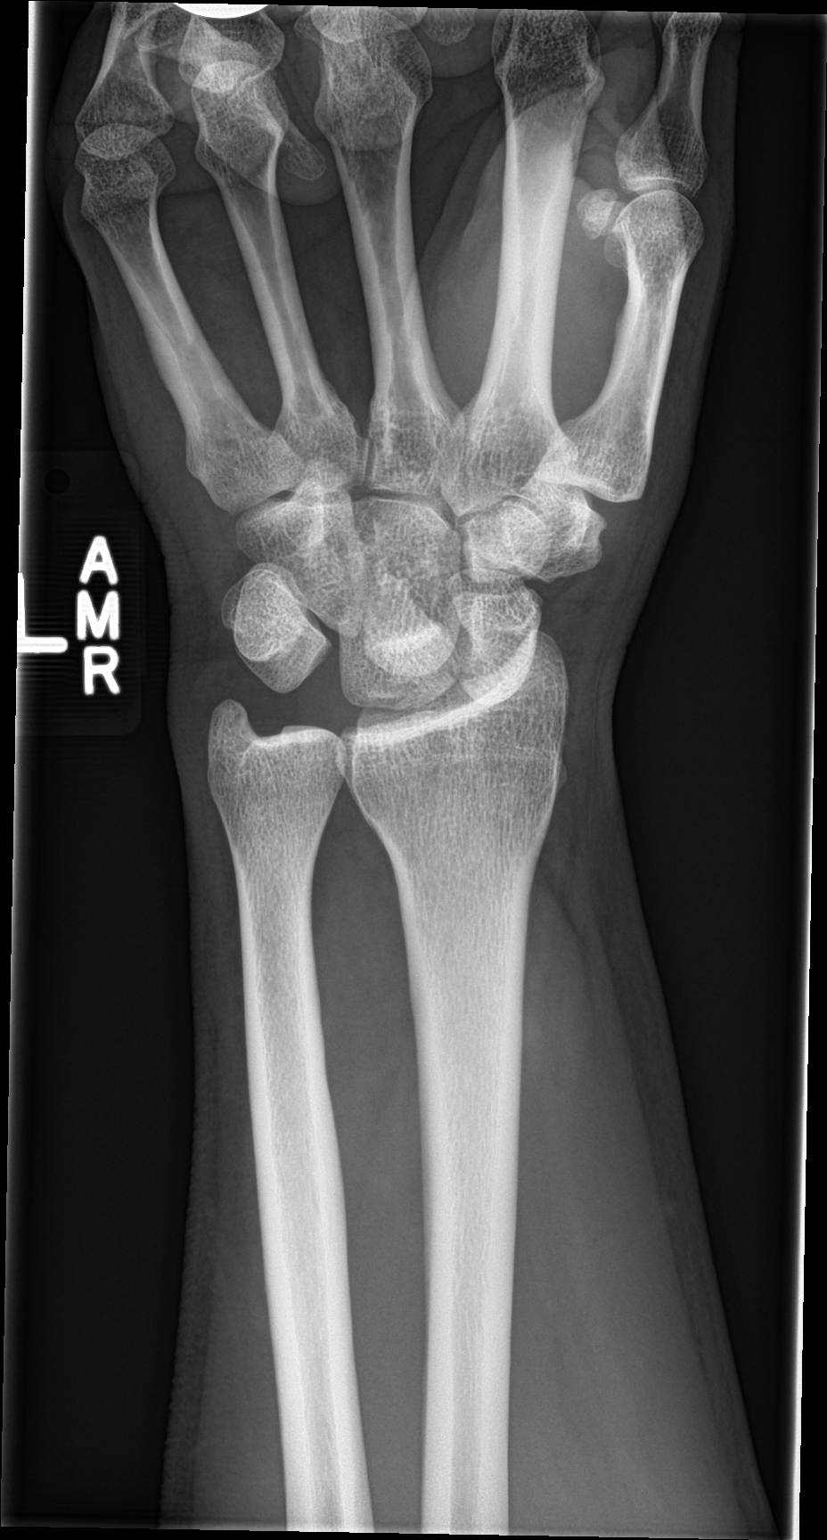

[wrist navicular]
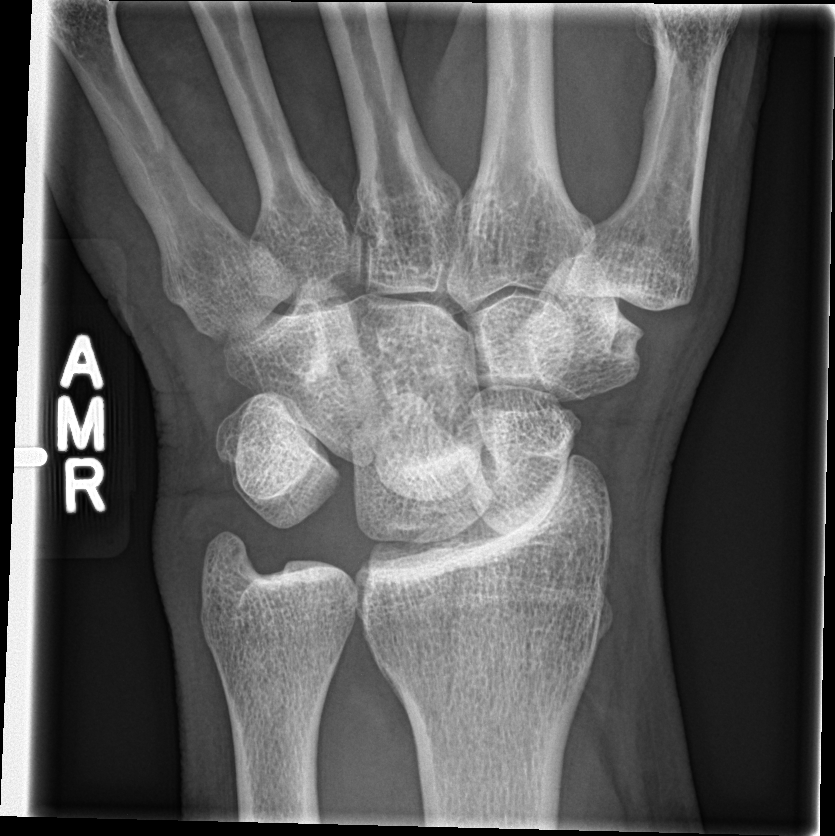

[wrist obl]
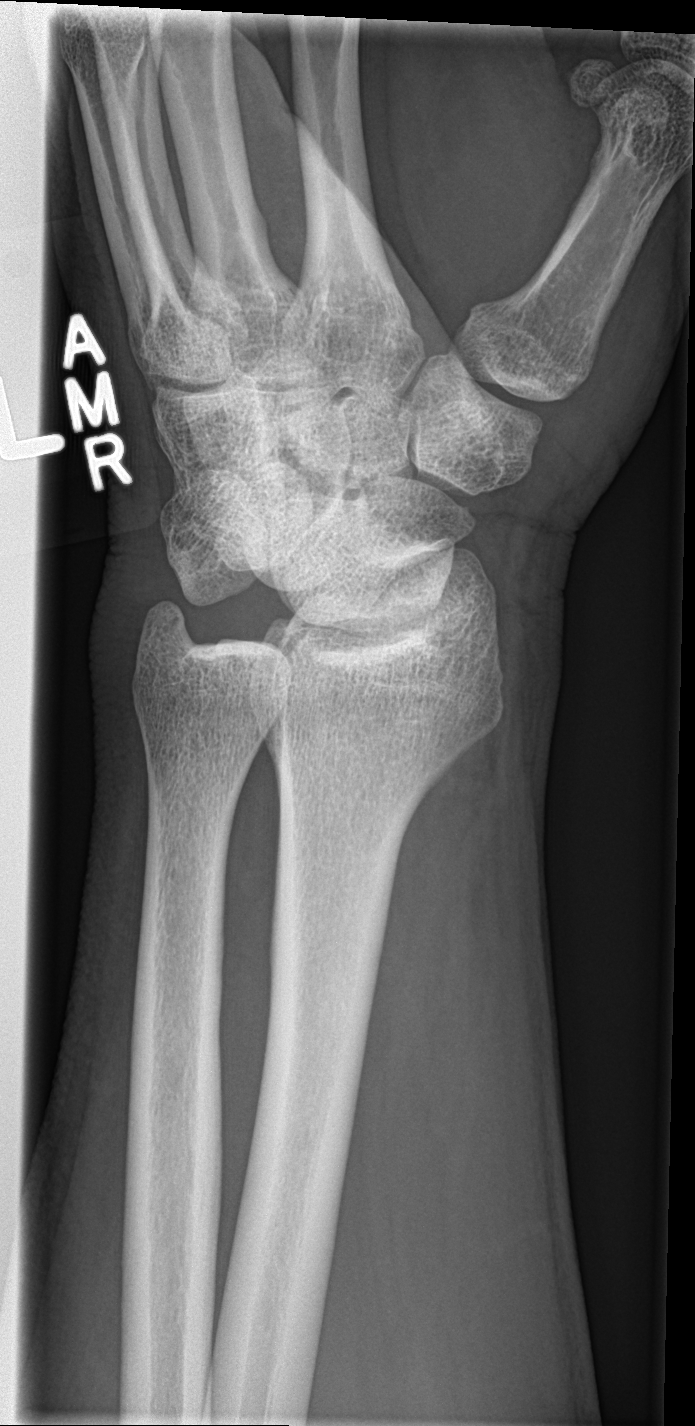

[wrist lat]
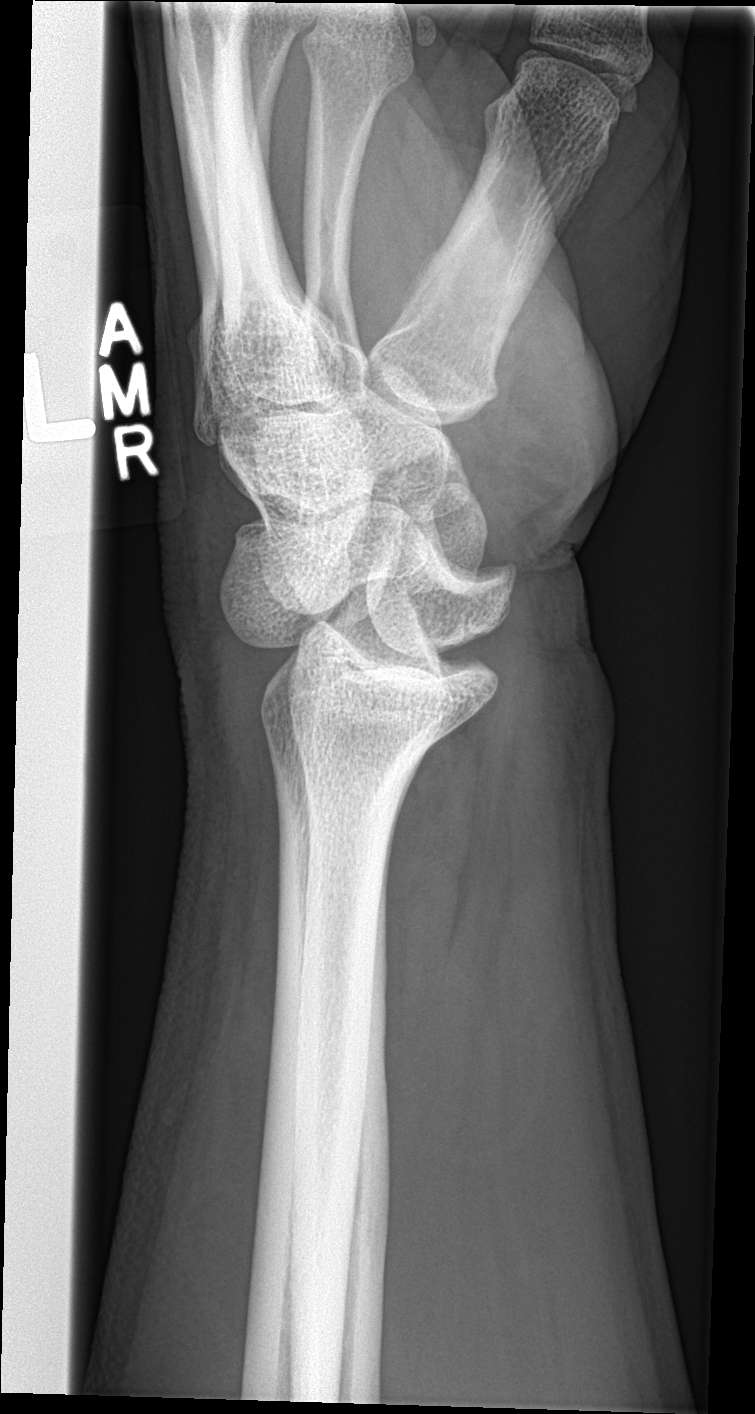

[4 of 4 positions shown; findings below may reference images not displayed]

FINDINGS: Frontal, oblique, lateral, and ulnar deviation scaphoid images were
obtained. There is perilunate dislocation. No fracture evident. No
appreciable arthropathy.
IMPRESSION: Perilunate dislocation. No fracture evident. No appreciable
arthropathy.

## 2022-02-11 ENCOUNTER — Other Ambulatory Visit: Payer: Self-pay

## 2022-02-11 ENCOUNTER — Inpatient Hospital Stay (HOSPITAL_COMMUNITY)

## 2022-02-11 ENCOUNTER — Encounter (HOSPITAL_COMMUNITY): Payer: Self-pay

## 2022-02-11 ENCOUNTER — Emergency Department (HOSPITAL_COMMUNITY)

## 2022-02-11 ENCOUNTER — Inpatient Hospital Stay (HOSPITAL_COMMUNITY)
Admission: EM | Admit: 2022-02-11 | Discharge: 2022-02-17 | DRG: 853 | Attending: Internal Medicine | Admitting: Internal Medicine

## 2022-02-11 DIAGNOSIS — K420 Umbilical hernia with obstruction, without gangrene: Secondary | ICD-10-CM | POA: Diagnosis present

## 2022-02-11 DIAGNOSIS — F32A Depression, unspecified: Secondary | ICD-10-CM | POA: Diagnosis present

## 2022-02-11 DIAGNOSIS — Y92009 Unspecified place in unspecified non-institutional (private) residence as the place of occurrence of the external cause: Secondary | ICD-10-CM | POA: Diagnosis not present

## 2022-02-11 DIAGNOSIS — E782 Mixed hyperlipidemia: Secondary | ICD-10-CM | POA: Diagnosis not present

## 2022-02-11 DIAGNOSIS — E871 Hypo-osmolality and hyponatremia: Secondary | ICD-10-CM | POA: Diagnosis present

## 2022-02-11 DIAGNOSIS — Z20822 Contact with and (suspected) exposure to covid-19: Secondary | ICD-10-CM | POA: Diagnosis present

## 2022-02-11 DIAGNOSIS — I10 Essential (primary) hypertension: Secondary | ICD-10-CM | POA: Diagnosis present

## 2022-02-11 DIAGNOSIS — E876 Hypokalemia: Secondary | ICD-10-CM | POA: Diagnosis present

## 2022-02-11 DIAGNOSIS — W19XXXA Unspecified fall, initial encounter: Secondary | ICD-10-CM | POA: Diagnosis not present

## 2022-02-11 DIAGNOSIS — E872 Acidosis, unspecified: Secondary | ICD-10-CM | POA: Diagnosis present

## 2022-02-11 DIAGNOSIS — N170 Acute kidney failure with tubular necrosis: Secondary | ICD-10-CM | POA: Diagnosis present

## 2022-02-11 DIAGNOSIS — R571 Hypovolemic shock: Secondary | ICD-10-CM | POA: Diagnosis present

## 2022-02-11 DIAGNOSIS — E861 Hypovolemia: Secondary | ICD-10-CM | POA: Diagnosis present

## 2022-02-11 DIAGNOSIS — R55 Syncope and collapse: Secondary | ICD-10-CM | POA: Diagnosis present

## 2022-02-11 DIAGNOSIS — Z79899 Other long term (current) drug therapy: Secondary | ICD-10-CM | POA: Diagnosis not present

## 2022-02-11 DIAGNOSIS — W1830XA Fall on same level, unspecified, initial encounter: Secondary | ICD-10-CM | POA: Diagnosis present

## 2022-02-11 DIAGNOSIS — H05239 Hemorrhage of unspecified orbit: Secondary | ICD-10-CM

## 2022-02-11 DIAGNOSIS — N179 Acute kidney failure, unspecified: Secondary | ICD-10-CM | POA: Diagnosis not present

## 2022-02-11 DIAGNOSIS — E7800 Pure hypercholesterolemia, unspecified: Secondary | ICD-10-CM | POA: Diagnosis present

## 2022-02-11 DIAGNOSIS — K56609 Unspecified intestinal obstruction, unspecified as to partial versus complete obstruction: Secondary | ICD-10-CM

## 2022-02-11 DIAGNOSIS — Z87442 Personal history of urinary calculi: Secondary | ICD-10-CM | POA: Diagnosis not present

## 2022-02-11 DIAGNOSIS — S0011XA Contusion of right eyelid and periocular area, initial encounter: Secondary | ICD-10-CM | POA: Diagnosis present

## 2022-02-11 DIAGNOSIS — E785 Hyperlipidemia, unspecified: Secondary | ICD-10-CM | POA: Diagnosis present

## 2022-02-11 DIAGNOSIS — E860 Dehydration: Secondary | ICD-10-CM | POA: Diagnosis present

## 2022-02-11 LAB — URINALYSIS, ROUTINE W REFLEX MICROSCOPIC
Bacteria, UA: NONE SEEN
Bilirubin Urine: NEGATIVE
Glucose, UA: NEGATIVE mg/dL
Ketones, ur: 20 mg/dL — AB
Leukocytes,Ua: NEGATIVE
Nitrite: NEGATIVE
Protein, ur: NEGATIVE mg/dL
Specific Gravity, Urine: 1.013 (ref 1.005–1.030)
pH: 5 (ref 5.0–8.0)

## 2022-02-11 LAB — LACTIC ACID, PLASMA
Lactic Acid, Venous: 2.2 mmol/L (ref 0.5–1.9)
Lactic Acid, Venous: 1.5 mmol/L (ref 0.5–1.9)

## 2022-02-11 LAB — CBC WITH DIFFERENTIAL/PLATELET
Abs Immature Granulocytes: 0.07 10*3/uL (ref 0.00–0.07)
Basophils Absolute: 0 10*3/uL (ref 0.0–0.1)
Basophils Relative: 0 %
Eosinophils Absolute: 0 10*3/uL (ref 0.0–0.5)
Eosinophils Relative: 1 %
HCT: 50.4 % (ref 39.0–52.0)
Hemoglobin: 18.2 g/dL — ABNORMAL HIGH (ref 13.0–17.0)
Immature Granulocytes: 1 %
Lymphocytes Relative: 15 %
Lymphs Abs: 2.1 10*3/uL (ref 0.7–4.0)
MCH: 31.3 pg (ref 26.0–34.0)
MCHC: 36.1 g/dL — ABNORMAL HIGH (ref 30.0–36.0)
MCV: 86.7 fL (ref 80.0–100.0)
Monocytes Absolute: 1.4 10*3/uL — ABNORMAL HIGH (ref 0.1–1.0)
Monocytes Relative: 12 %
Neutro Abs: 9.1 10*3/uL — ABNORMAL HIGH (ref 1.7–7.7)
Neutrophils Relative %: 71 %
Platelets: 329 10*3/uL (ref 150–400)
RBC: 5.81 MIL/uL (ref 4.22–5.81)
RDW: 11.2 % — ABNORMAL LOW (ref 11.5–15.5)
WBC: 13.3 10*3/uL — ABNORMAL HIGH (ref 4.0–10.5)
nRBC: 0 % (ref 0.0–0.2)

## 2022-02-11 LAB — RAPID URINE DRUG SCREEN, HOSP PERFORMED
Amphetamines: NOT DETECTED
Barbiturates: NOT DETECTED
Benzodiazepines: NOT DETECTED
Cocaine: NOT DETECTED
Opiates: NOT DETECTED
Tetrahydrocannabinol: NOT DETECTED

## 2022-02-11 LAB — BASIC METABOLIC PANEL
Anion gap: 19 — ABNORMAL HIGH (ref 5–15)
Anion gap: 15 (ref 5–15)
BUN: 92 mg/dL — ABNORMAL HIGH (ref 6–20)
BUN: 87 mg/dL — ABNORMAL HIGH (ref 6–20)
CO2: 26 mmol/L (ref 22–32)
CO2: 25 mmol/L (ref 22–32)
Calcium: 9 mg/dL (ref 8.9–10.3)
Calcium: 8.1 mg/dL — ABNORMAL LOW (ref 8.9–10.3)
Chloride: 70 mmol/L — ABNORMAL LOW (ref 98–111)
Chloride: 77 mmol/L — ABNORMAL LOW (ref 98–111)
Creatinine, Ser: 2.54 mg/dL — ABNORMAL HIGH (ref 0.61–1.24)
Creatinine, Ser: 2.07 mg/dL — ABNORMAL HIGH (ref 0.61–1.24)
GFR, Estimated: 30 mL/min — ABNORMAL LOW (ref >60)
GFR, Estimated: 38 mL/min — ABNORMAL LOW (ref >60)
Glucose, Bld: 127 mg/dL — ABNORMAL HIGH (ref 70–99)
Glucose, Bld: 104 mg/dL — ABNORMAL HIGH (ref 70–99)
Potassium: 2.8 mmol/L — ABNORMAL LOW (ref 3.5–5.1)
Potassium: 3 mmol/L — ABNORMAL LOW (ref 3.5–5.1)
Sodium: 115 mmol/L — CL (ref 135–145)
Sodium: 117 mmol/L — CL (ref 135–145)

## 2022-02-11 LAB — TSH: TSH: 0.729 u[IU]/mL (ref 0.350–4.500)

## 2022-02-11 LAB — MAGNESIUM: Magnesium: 2.8 mg/dL — ABNORMAL HIGH (ref 1.7–2.4)

## 2022-02-11 LAB — SODIUM, URINE, RANDOM: Sodium, Ur: 15 mmol/L

## 2022-02-11 LAB — RESP PANEL BY RT-PCR (FLU A&B, COVID) ARPGX2
Influenza A by PCR: NEGATIVE
Influenza B by PCR: NEGATIVE
SARS Coronavirus 2 by RT PCR: NEGATIVE

## 2022-02-11 LAB — OSMOLALITY, URINE: Osmolality, Ur: 510 mOsm/kg (ref 300–900)

## 2022-02-11 LAB — CK: Total CK: 484 U/L — ABNORMAL HIGH (ref 49–397)

## 2022-02-11 LAB — OSMOLALITY: Osmolality: 275 mOsm/kg (ref 275–295)

## 2022-02-11 LAB — SODIUM: Sodium: 115 mmol/L — CL (ref 135–145)

## 2022-02-11 LAB — HIV ANTIBODY (ROUTINE TESTING W REFLEX): HIV Screen 4th Generation wRfx: NONREACTIVE

## 2022-02-11 MED ORDER — SODIUM CHLORIDE 0.9 % IV SOLN: INTRAVENOUS | Status: DC

## 2022-02-11 MED ORDER — ONDANSETRON HCL 4 MG/2ML IJ SOLN
4.0000 mg | Freq: Four times a day (QID) | INTRAMUSCULAR | Status: DC | PRN
  Administered 2022-02-11 – 2022-02-13 (×3): 4 mg via INTRAVENOUS
  Filled 2022-02-11 (×3): qty 2

## 2022-02-11 MED ORDER — SODIUM CHLORIDE 0.9 % IV BOLUS
1000.0000 mL | Freq: Once | INTRAVENOUS | Status: AC
  Administered 2022-02-11: 1000 mL via INTRAVENOUS

## 2022-02-11 MED ORDER — CHLORHEXIDINE GLUCONATE CLOTH 2 % EX PADS
6.0000 | MEDICATED_PAD | Freq: Every day | CUTANEOUS | Status: DC
  Administered 2022-02-11 – 2022-02-16 (×5): 6 via TOPICAL

## 2022-02-11 MED ORDER — SODIUM CHLORIDE 0.9 % IV BOLUS: 1000.0000 mL | Freq: Once | INTRAVENOUS | Status: DC

## 2022-02-11 MED ORDER — POTASSIUM CHLORIDE CRYS ER 20 MEQ PO TBCR
40.0000 meq | EXTENDED_RELEASE_TABLET | ORAL | Status: AC
  Administered 2022-02-11: 40 meq via ORAL
  Filled 2022-02-11 (×2): qty 2

## 2022-02-11 MED ORDER — ACETAMINOPHEN 650 MG RE SUPP: 650.0000 mg | Freq: Four times a day (QID) | RECTAL | Status: DC | PRN

## 2022-02-11 MED ORDER — ACETAMINOPHEN 325 MG PO TABS: 650.0000 mg | ORAL_TABLET | Freq: Four times a day (QID) | ORAL | Status: DC | PRN

## 2022-02-11 MED ORDER — HEPARIN SODIUM (PORCINE) 5000 UNIT/ML IJ SOLN
5000.0000 [IU] | Freq: Three times a day (TID) | INTRAMUSCULAR | Status: DC
  Administered 2022-02-11 – 2022-02-17 (×17): 5000 [IU] via SUBCUTANEOUS
  Filled 2022-02-11 (×15): qty 1

## 2022-02-11 MED ORDER — ONDANSETRON HCL 4 MG PO TABS: 4.0000 mg | ORAL_TABLET | Freq: Four times a day (QID) | ORAL | Status: DC | PRN

## 2022-02-11 NOTE — Consult Note (Signed)
Nephrology Consult   Assessment/Recommendations:  51 year old incarcerated male w/ PMH HTN, HLD p/w falls/dizziness, found to have severe hypoNa and AKI.  AKI: Likely secondary to prerenal injury in the context of ACEI use. Last known baseline Cr available to me was 0.95 in 2020 -renal u/s, UA w/ microscopy, hold ACEI. Monitor daily labs. Hoping he will continue to recover with fluids -Avoid nephrotoxic medications including NSAIDs and iodinated intravenous contrast exposure unless the latter is absolutely indicated.  Preferred narcotic agents for pain control are hydromorphone, fentanyl, and methadone. Morphine should not be used. Avoid Baclofen and avoid oral sodium phosphate and magnesium citrate based laxatives / bowel preps. Continue strict Input and Output monitoring. Will monitor the patient closely with you and intervene or adjust therapy as indicated by changes in clinical status/labs   Severe Hyponatremia -likely hypovolemic hyponatemia -rec'd 1L NS in ER, recommended to ER on rechecking BMP post fluids--up to 117 now -urine + osm studies + TSH ordered -monitor Na q4h, goal Na by ~9am tomorrow should be around 121-123, avoid overcorrection, can use hypotonic fluids if overcorrecting -replete K (will help with osm) -was about to start low rate mIVF: NS 75cc/hr x 12 hrs however now I see he is receiving another 1L bolus. Would recommend just watching for now post bolus  Hypertension: -on the low side right now, hold home anti-HTNs  Orthostatic dizziness -as above, hold anti-HTNs for now  Hypokalemia -replete PRN  Recommendations conveyed to primary service.    Anthony Sar [facility] 02/11/2022 1:11 PM   _____________________________________________________________________________________   History of Present Illness: Logan Garrett is a/an 51 y.o. male with a past medical history of HTN, HLD, currently incarcerated who presents to [facility] with falls, weakness,  dizziness. Has been happening for several days. On Diovan-HCTZ for HTN previously, but med list just updated and seems that he is on lisinopril only. There was an episodes of patient stiffening up, thought to be seizure. Found to have severe hyponatremia, Na 115. Cr up to 2.5, last known Cr in 2020 was 0.95 (Care Everywhere). BP 94/58 on presentation. Also found to have a Hgb of  18.2 and lactate of 2.2. Rec'd 1L NS in ER. Patient reports that he has been having low PO intake, dizziness (especially upon standing), some n/v for the last 1-2 weeks. Feels tired and cold now. He denies any recent NSAID use.   Medications:  Current Facility-Administered Medications  Medication Dose Route Frequency Provider Last Rate Last Admin   sodium chloride 0.9 % bolus 1,000 mL  1,000 mL Intravenous Once Benjiman Core, MD       Current Outpatient Medications  Medication Sig Dispense Refill   acetaminophen (TYLENOL) 325 MG tablet Take 650 mg by mouth every 6 (six) hours as needed for mild pain, headache or fever.     atorvastatin (LIPITOR) 20 MG tablet Take 20 mg by mouth daily.     lisinopril (ZESTRIL) 20 MG tablet Take 20 mg by mouth daily.       ALLERGIES Patient has no known allergies.  MEDICAL HISTORY Past Medical History:  Diagnosis Date   Depression    High cholesterol    History of kidney stones    Hyperlipidemia    Hypertension      SOCIAL HISTORY Social History   Socioeconomic History   Marital status: Married    Spouse name: Not on file   Number of children: Not on file   Years of education: Not on file  Highest education level: Not on file  Occupational History   Not on file  Tobacco Use   Smoking status: Never   Smokeless tobacco: Never  Vaping Use   Vaping Use: Never used  Substance and Sexual Activity   Alcohol use: Yes    Comment: occ    Drug use: No   Sexual activity: Not on file  Other Topics Concern   Not on file  Social History Narrative   ** Merged  History Encounter **       Social Determinants of Health   Financial Resource Strain: Not on file  Food Insecurity: Not on file  Transportation Needs: Not on file  Physical Activity: Not on file  Stress: Not on file  Social Connections: Not on file  Intimate Partner Violence: Not on file     FAMILY HISTORY No family history on file.   Review of Systems: 12 systems reviewed Otherwise as per HPI, all other systems reviewed and negative  Physical Exam: Vitals:   02/11/22 1230 02/11/22 1300  BP: 96/64 98/61  Pulse: 73 75  Resp: 14 15  Temp:    SpO2: 100% 97%   No intake/output data recorded. No intake or output data in the 24 hours ending 02/11/22 1311 General: well-appearing, no acute distress HEENT: anicteric sclera, oropharynx clear without lesions, rt periorbital ecchymosis, dry MMM CV: regular rate, normal rhythm, no murmurs, no gallops, no rubs Lungs: clear to auscultation bilaterally, normal work of breathing Abd: soft, non-tender, non-distended Skin: no visible lesions or rashes Psych: alert, engaged, appropriate mood and affect Musculoskeletal: no obvious deformities Neuro: normal speech, no gross focal deficits   Test Results Reviewed Lab Results  Component Value Date   NA 115 (LL) 02/11/2022   K 2.8 (L) 02/11/2022   CL 70 (L) 02/11/2022   CO2 26 02/11/2022   BUN 92 (H) 02/11/2022   CREATININE 2.54 (H) 02/11/2022   CALCIUM 9.0 02/11/2022   ALBUMIN 3.6 04/13/2012     I have reviewed all relevant outside healthcare records related to the patient's kidney injury.

## 2022-02-11 NOTE — ED Notes (Signed)
Pt noted taking bair hugger and placing it beside him in bed instead of on him. Redirection given to keep bair hugger on and the reason behind it.

## 2022-02-11 NOTE — H&P (Signed)
History and Physical    Branch Pelly [id number] DOB: [birth year] DOA: 02/11/2022  PCP: Pcp, No  Patient coming from: correctional facility  I have personally briefly reviewed patient's old medical records in [place]  Chief Complaint: fall  HPI: Logan Garrett is a 51 y.o. male with medical history significant of hypertension on ACE inhibitor, hyperlipidemia, who is currently an inmate at a correctional facility.  He is brought to the emergency room today when he had an unwitnessed fall sometime last evening.  Noted to have bruising around his right orbit.  He reports feeling dizzy on standing.  He has been increasingly weak.  He is not had any vomiting or diarrhea.  P.o. intake has been poor.  Does not have any nausea.  No chest pain or trouble breathing.  He says he does feel increasingly cold.  He also feels very dehydrated.  Per report, while he was being transported to the hospital, he was sitting in the police vehicle and noted to have an episode where he "stiffened up".  Did not appear to have any post episode confusion or lethargy.  Entire episode lasted approximately 10 seconds.  ED Course: Noted to be significantly hyponatremic with a sodium of 115.  Creatinine greater than 2.  BUN elevated.  Noted to be hypotensive on arrival.  Blood pressure responded to IV fluids.  Lactic acid also elevated on admission with improvement with IV fluids.  He was afebrile.  Urinalysis without signs of infection.  Urine tox screen also unremarkable.  CT imaging of head and neck did not show right periorbital hematoma, otherwise no other acute bony injuries.  Review of Systems: As per HPI otherwise 10 point review of systems negative.    Past Medical History:  Diagnosis Date   Depression    High cholesterol    History of kidney stones    Hyperlipidemia    Hypertension     Past Surgical History:  Procedure Laterality Date   CLOSED REDUCTION NASAL FRACTURE Bilateral 08/10/2018    Procedure: Closed nasal fracture and septal fracture reduction  Internal and external splinting;  Surgeon: Wallace Going, DO;  Location: [facility];  Service: Plastics;  Laterality: Bilateral;   ORIF WRIST FRACTURE Left 07/31/2018   Procedure: OPEN REDUCTION INTERNAL FIXATION (ORIF) LEFT PERILUNATE DISLOCATION;  Surgeon: Charlotte Crumb, MD;  Location: [facility];  Service: Orthopedics;  Laterality: Left;    Social History:  reports that he has never smoked. He has never used smokeless tobacco. He reports current alcohol use. He reports that he does not use drugs.  No Known Allergies   Family history: Family history reviewed and not pertinent  Prior to Admission medications   Medication Sig Start Date End Date Taking? Authorizing Provider  acetaminophen (TYLENOL) 325 MG tablet Take 650 mg by mouth every 6 (six) hours as needed for mild pain, headache or fever.   Yes [provider]  atorvastatin (LIPITOR) 20 MG tablet Take 20 mg by mouth daily. 02/09/22  Yes [provider]  lisinopril (ZESTRIL) 20 MG tablet Take 20 mg by mouth daily. 02/09/22  Yes [provider]    Physical Exam: Vitals:   02/11/22 1630 02/11/22 1700 02/11/22 1702 02/11/22 1730  BP: 100/73 96/66  97/65  Pulse:  78  85  Resp:  16  16  Temp:   97.9 F (36.6 C)   TempSrc:   Oral   SpO2:  97%  97%  Weight:  Height:        Constitutional: NAD, calm, comfortable Eyes: bruising around right orbit ENMT: Mucous membranes are moist. Posterior pharynx clear of any exudate or lesions.Normal dentition.  Neck: normal, supple, no masses, no thyromegaly Respiratory: clear to auscultation bilaterally, no wheezing, no crackles. Normal respiratory effort. No accessory muscle use.  Cardiovascular: Regular rate and rhythm, no murmurs / rubs / gallops. No extremity edema. 2+ pedal pulses. No carotid bruits.  Abdomen: no tenderness, no masses palpated. No hepatosplenomegaly. Bowel  sounds positive.  Musculoskeletal: no clubbing / cyanosis. No joint deformity upper and lower extremities. Good ROM, no contractures. Normal muscle tone.  Skin: no rashes, lesions, ulcers. No induration Neurologic: CN 2-12 grossly intact. Sensation intact, DTR normal. Strength 5/5 in all 4.  Psychiatric: Normal judgment and insight. Alert and oriented x 3. Normal mood.    Labs on Admission: I have personally reviewed following labs and imaging studies  CBC: Recent Labs  Lab 02/11/22 0952  WBC 13.3*  NEUTROABS 9.1*  HGB 18.2*  HCT 50.4  MCV 86.7  PLT [id number]   Basic Metabolic Panel: Recent Labs  Lab 02/11/22 0952 02/11/22 1218  NA 115* 117*  K 2.8* 3.0*  CL 70* 77*  CO2 26 25  GLUCOSE 127* 104*  BUN 92* 87*  CREATININE 2.54* 2.07*  CALCIUM 9.0 8.1*  MG 2.8*  --    GFR: Estimated Creatinine Clearance: 48.2 mL/min (A) (by C-G formula based on SCr of 2.07 mg/dL (H)). Liver Function Tests: No results for input(s): "AST", "ALT", "ALKPHOS", "BILITOT", "PROT", "ALBUMIN" in the last 168 hours. No results for input(s): "LIPASE", "AMYLASE" in the last 168 hours. No results for input(s): "AMMONIA" in the last 168 hours. Coagulation Profile: No results for input(s): "INR", "PROTIME" in the last 168 hours. Cardiac Enzymes: Recent Labs  Lab 02/11/22 0952  CKTOTAL 484*   BNP (last 3 results) No results for input(s): "PROBNP" in the last 8760 hours. HbA1C: No results for input(s): "HGBA1C" in the last 72 hours. CBG: No results for input(s): "GLUCAP" in the last 168 hours. Lipid Profile: No results for input(s): "CHOL", "HDL", "LDLCALC", "TRIG", "CHOLHDL", "LDLDIRECT" in the last 72 hours. Thyroid Function Tests: Recent Labs    02/11/22 1117  TSH 0.729   Anemia Panel: No results for input(s): "VITAMINB12", "FOLATE", "FERRITIN", "TIBC", "IRON", "RETICCTPCT" in the last 72 hours. Urine analysis:    Component Value Date/Time   COLORURINE YELLOW 02/11/2022 1311    APPEARANCEUR CLEAR 02/11/2022 1311   LABSPEC 1.013 02/11/2022 1311   PHURINE 5.0 02/11/2022 1311   GLUCOSEU NEGATIVE 02/11/2022 1311   HGBUR MODERATE (A) 02/11/2022 1311   BILIRUBINUR NEGATIVE 02/11/2022 1311   KETONESUR 20 (A) 02/11/2022 1311   PROTEINUR NEGATIVE 02/11/2022 1311   UROBILINOGEN 0.2 04/13/2012 1057   NITRITE NEGATIVE 02/11/2022 1311   LEUKOCYTESUR NEGATIVE 02/11/2022 1311    Radiological Exams on Admission: US RENAL  Result Date: 02/11/2022 CLINICAL DATA:  Renal dysfunction EXAM: RENAL / URINARY TRACT ULTRASOUND COMPLETE COMPARISON:  CT abdomen done on 04/13/2012 FINDINGS: Right Kidney: Renal measurements: 11.9 x 7.5 x 6.8 cm = volume: 315.1 mL. There is no hydronephrosis. There is increased cortical echogenicity. Left Kidney: Renal measurements: 12.5 x 6.2 by 6.1 cm = volume: 249.1 mL. There is no hydronephrosis. There is increased cortical echogenicity. Technologist observed 6 mm hyperechoic focus in the upper pole of left kidney. This may suggest nonobstructing calculus or partial volume averaging of renal sinus fat. Bladder: Unremarkable. Other: None. IMPRESSION: There is  no hydronephrosis. There is increased cortical echogenicity in the kidneys suggesting possible medical renal disease. 6 mm hyperechoic focus in the left kidney may suggest nonobstructing calculus or partial volume averaging artifact. Electronically Signed   By: Elmer Picker M.D.   On: 02/11/2022 13:53   CT Head Wo Contrast  Result Date: 02/11/2022 CLINICAL DATA:  Confusion and possible seizure after unwitnessed fall last night. Bruising to the right eye. EXAM: CT HEAD WITHOUT CONTRAST CT MAXILLOFACIAL WITHOUT CONTRAST CT CERVICAL SPINE WITHOUT CONTRAST TECHNIQUE: Multidetector CT imaging of the head, cervical spine, and maxillofacial structures were performed using the standard protocol without intravenous contrast. Multiplanar CT image reconstructions of the cervical spine and maxillofacial structures  were also generated. RADIATION DOSE REDUCTION: This exam was performed according to the departmental dose-optimization program which includes automated exposure control, adjustment of the mA and/or kV according to patient size and/or use of iterative reconstruction technique. COMPARISON:  CT maxillofacial dated July 31, 2018. FINDINGS: CT HEAD FINDINGS Brain: No evidence of acute infarction, hemorrhage, hydrocephalus, extra-axial collection or mass lesion/mass effect. Mild generalized cerebral atrophy. Vascular: No hyperdense vessel or unexpected calcification. Skull: Normal. Negative for fracture or focal lesion. Other: None. CT MAXILLOFACIAL FINDINGS Osseous: No acute fracture or mandibular dislocation. No destructive process. Orbits: Negative. No traumatic or inflammatory finding. Sinuses: Small retention cysts in both maxillary sinuses. Otherwise clear. Soft tissues: Small right periorbital hematoma. CT CERVICAL SPINE FINDINGS Alignment: Straightening of the normal cervical lordosis. No traumatic malalignment. Skull base and vertebrae: No acute fracture. No primary bone lesion or focal pathologic process. Soft tissues and spinal canal: No prevertebral fluid or swelling. No visible canal hematoma. Disc levels: Mild-to-moderate multilevel disc height loss and facet uncovertebral hypertrophy throughout the cervical spine. Upper chest: Negative. Other: 9 mm hypodense nodule in the left thyroid lobe. Not clinically significant; no follow-up imaging recommended (ref: J Am Coll Radiol. 2015 Feb;12(2): 143-50). IMPRESSION: 1. No acute intracranial abnormality. Small right periorbital hematoma. 2. No acute facial fracture. 3. No acute cervical spine fracture or traumatic listhesis. Electronically Signed   By: Titus Dubin M.D.   On: 02/11/2022 11:03   CT Maxillofacial Wo Contrast  Result Date: 02/11/2022 CLINICAL DATA:  Confusion and possible seizure after unwitnessed fall last night. Bruising to the right eye.  EXAM: CT HEAD WITHOUT CONTRAST CT MAXILLOFACIAL WITHOUT CONTRAST CT CERVICAL SPINE WITHOUT CONTRAST TECHNIQUE: Multidetector CT imaging of the head, cervical spine, and maxillofacial structures were performed using the standard protocol without intravenous contrast. Multiplanar CT image reconstructions of the cervical spine and maxillofacial structures were also generated. RADIATION DOSE REDUCTION: This exam was performed according to the departmental dose-optimization program which includes automated exposure control, adjustment of the mA and/or kV according to patient size and/or use of iterative reconstruction technique. COMPARISON:  CT maxillofacial dated July 31, 2018. FINDINGS: CT HEAD FINDINGS Brain: No evidence of acute infarction, hemorrhage, hydrocephalus, extra-axial collection or mass lesion/mass effect. Mild generalized cerebral atrophy. Vascular: No hyperdense vessel or unexpected calcification. Skull: Normal. Negative for fracture or focal lesion. Other: None. CT MAXILLOFACIAL FINDINGS Osseous: No acute fracture or mandibular dislocation. No destructive process. Orbits: Negative. No traumatic or inflammatory finding. Sinuses: Small retention cysts in both maxillary sinuses. Otherwise clear. Soft tissues: Small right periorbital hematoma. CT CERVICAL SPINE FINDINGS Alignment: Straightening of the normal cervical lordosis. No traumatic malalignment. Skull base and vertebrae: No acute fracture. No primary bone lesion or focal pathologic process. Soft tissues and spinal canal: No prevertebral fluid or swelling. No visible canal  hematoma. Disc levels: Mild-to-moderate multilevel disc height loss and facet uncovertebral hypertrophy throughout the cervical spine. Upper chest: Negative. Other: 9 mm hypodense nodule in the left thyroid lobe. Not clinically significant; no follow-up imaging recommended (ref: J Am Coll Radiol. 2015 Feb;12(2): 143-50). IMPRESSION: 1. No acute intracranial abnormality. Small  right periorbital hematoma. 2. No acute facial fracture. 3. No acute cervical spine fracture or traumatic listhesis. Electronically Signed   By: Titus Dubin M.D.   On: 02/11/2022 11:03   CT Cervical Spine Wo Contrast  Result Date: 02/11/2022 CLINICAL DATA:  Confusion and possible seizure after unwitnessed fall last night. Bruising to the right eye. EXAM: CT HEAD WITHOUT CONTRAST CT MAXILLOFACIAL WITHOUT CONTRAST CT CERVICAL SPINE WITHOUT CONTRAST TECHNIQUE: Multidetector CT imaging of the head, cervical spine, and maxillofacial structures were performed using the standard protocol without intravenous contrast. Multiplanar CT image reconstructions of the cervical spine and maxillofacial structures were also generated. RADIATION DOSE REDUCTION: This exam was performed according to the departmental dose-optimization program which includes automated exposure control, adjustment of the mA and/or kV according to patient size and/or use of iterative reconstruction technique. COMPARISON:  CT maxillofacial dated July 31, 2018. FINDINGS: CT HEAD FINDINGS Brain: No evidence of acute infarction, hemorrhage, hydrocephalus, extra-axial collection or mass lesion/mass effect. Mild generalized cerebral atrophy. Vascular: No hyperdense vessel or unexpected calcification. Skull: Normal. Negative for fracture or focal lesion. Other: None. CT MAXILLOFACIAL FINDINGS Osseous: No acute fracture or mandibular dislocation. No destructive process. Orbits: Negative. No traumatic or inflammatory finding. Sinuses: Small retention cysts in both maxillary sinuses. Otherwise clear. Soft tissues: Small right periorbital hematoma. CT CERVICAL SPINE FINDINGS Alignment: Straightening of the normal cervical lordosis. No traumatic malalignment. Skull base and vertebrae: No acute fracture. No primary bone lesion or focal pathologic process. Soft tissues and spinal canal: No prevertebral fluid or swelling. No visible canal hematoma. Disc levels:  Mild-to-moderate multilevel disc height loss and facet uncovertebral hypertrophy throughout the cervical spine. Upper chest: Negative. Other: 9 mm hypodense nodule in the left thyroid lobe. Not clinically significant; no follow-up imaging recommended (ref: J Am Coll Radiol. 2015 Feb;12(2): 143-50). IMPRESSION: 1. No acute intracranial abnormality. Small right periorbital hematoma. 2. No acute facial fracture. 3. No acute cervical spine fracture or traumatic listhesis. Electronically Signed   By: Titus Dubin M.D.   On: 02/11/2022 11:03   DG Chest 2 View  Result Date: 02/11/2022 CLINICAL DATA:  Syncope, fall, weakness EXAM: CHEST - 2 VIEW COMPARISON:  None Available. FINDINGS: The heart size and mediastinal contours are within normal limits. Both lungs are clear. The visualized skeletal structures are unremarkable. Trachea midline. No acute osseous finding. Skin fold noted over the left apex. Minor basilar atelectasis versus scarring. IMPRESSION: No active cardiopulmonary disease. Electronically Signed   By: Jerilynn Mages.  Shick M.D.   On: 02/11/2022 10:56    EKG: Independently reviewed. Sinus rhythm without acute ischemic changes  Assessment/Plan Principal Problem:   Hyponatremia Active Problems:   AKI (acute kidney injury) ([place])   Syncope   Dehydration   Hyperlipidemia   Periorbital hematoma   Fall at home, initial encounter     Syncope Fall Periorbital hematoma -Likely related to hypovolemia/hypotension -Continue IV hydration -Unclear whether patient truly had a seizure, sounds more like post syncopal convulsion -Continue to monitor for any recurrence  Hyponatremia -Related to hypovolemia, decrease p.o. intake -He has been started on saline -Monitor sodium every 4 hours -Urine sodium is 15  Acute kidney injury -Due to hypovolemia in the setting  of ARB -Continue IV fluids -Renal ultrasound negative for hydronephrosis  Hyperlipidemia -Continue on statin  DVT prophylaxis: heparin   Code Status: full code  Family Communication: discussed with patient  Disposition Plan: return to correctional facility on discharge  Consults called: nephrology  Admission status: inpatient, stepdown   Kathie Dike MD [facility]   If 7PM-7AM, please contact night-coverage [email or web address]   02/11/2022, 6:03 PM

## 2022-02-11 NOTE — ED Provider Notes (Signed)
[facility] EMERGENCY DEPARTMENT Provider Note   CSN: [id number] Arrival date & time: 02/11/22  [id number]     History  Chief Complaint  Patient presents with   Marletta LorFall    Logan Garrett is a 51 y.o. male.   Fall Pertinent negatives include no chest pain, no abdominal pain, no headaches and no shortness of breath.        Logan Garrett is a 51 y.o. male with past medical history of hypertension and hyperlipidemia who is currently incarcerated and accompanied to the ER from the local jail with officer at bedside. He was brought in for evaluation of unwitnessed fall sometime last evening.  Patient states that he has been feeling weak and dizzy upon standing for several days.  He endorses several episodes of "passing out" and states that he feels very dehydrated and cold.  States that he is been on medical watch at the facility and he has been sleeping on a mattress on the floor.  He is noted to have swelling and bruising over his right eye.  Per officer at bedside, patient was placed in the vehicle to bring to the hospital and began to stiffen up and was thought to possibly be having a seizure.  The event lasted approximately 10 seconds.  Patient did not appear confused upon wakening per officer.  Patient denies headache, fever, chills, cough or dysuria symptoms.  Complains of tenderness of the right periorbital region but denies neck pain, back pain, pain of his ribs hips or legs.  Does not take blood thinners.     Home Medications Prior to Admission medications   Medication Sig Start Date End Date Taking? Authorizing Provider  acetaminophen (TYLENOL) 325 MG tablet Take 650 mg by mouth every 6 (six) hours as needed.    [provider]  rosuvastatin (CRESTOR) 20 MG tablet Take 20 mg by mouth daily.    [provider]  sertraline (ZOLOFT) 100 MG tablet Take 100 mg by mouth daily.    [provider]  valsartan-hydrochlorothiazide (DIOVAN-HCT) 80-12.5 MG per tablet  Take 1 tablet by mouth daily.    [provider]  valsartan-hydrochlorothiazide (DIOVAN-HCT) 80-12.5 MG tablet Take 1 tablet by mouth daily.    [provider]      Allergies    Patient has no known allergies.    Review of Systems   Review of Systems  Constitutional:  Negative for chills and fever.  HENT:         Bruising, tenderness swelling right periorbital area  Eyes:  Negative for pain and visual disturbance.  Respiratory:  Negative for shortness of breath.   Cardiovascular:  Negative for chest pain.  Gastrointestinal:  Negative for abdominal pain, nausea and vomiting.  Neurological:  Positive for dizziness, syncope and weakness. Negative for headaches.       Head injury    Physical Exam Updated Vital Signs BP 103/70 (BP Location: Left Arm)   Pulse 74   Temp (!) 96 F (35.6 C) (Rectal)   Resp 19   Ht 6\' 4"  (1.93 m)   Wt 81.6 kg   SpO2 100%   BMI 21.91 kg/m  Physical Exam Vitals and nursing note reviewed.  Constitutional:      Appearance: He is ill-appearing.  HENT:     Head:     Comments: Tenderness to palpation right periorbital area.  Ecchymosis and hematoma noted at the right eyebrow.  No tenderness of the face, no dental injury or malocclusion.  Right Ear: Tympanic membrane and ear canal normal. No hemotympanum.     Left Ear: Tympanic membrane and ear canal normal. No hemotympanum.     Nose: Nose normal.     Mouth/Throat:     Mouth: Mucous membranes are dry.     Comments: Mucous membranes very dry Eyes:     Extraocular Movements: Extraocular movements intact.     Conjunctiva/sclera: Conjunctivae normal.     Pupils: Pupils are equal, round, and reactive to light.  Cardiovascular:     Rate and Rhythm: Normal rate and regular rhythm.     Pulses: Normal pulses.     Heart sounds: No murmur heard. Pulmonary:     Effort: Pulmonary effort is normal. No respiratory distress.     Breath sounds: Normal breath sounds.  Chest:     Chest wall:  No tenderness.  Abdominal:     Palpations: Abdomen is soft.     Tenderness: There is no abdominal tenderness.  Musculoskeletal:        General: No tenderness. Normal range of motion.     Cervical back: Normal range of motion. No tenderness.     Right lower leg: No edema.     Left lower leg: No edema.  Skin:    Capillary Refill: Capillary refill takes 2 to 3 seconds.     Comments: Skin cool to touch, no pallor or erythema  Neurological:     General: No focal deficit present.     Mental Status: He is alert.     GCS: GCS eye subscore is 4. GCS verbal subscore is 5. GCS motor subscore is 6.     Sensory: Sensation is intact. No sensory deficit.     Motor: Motor function is intact. No weakness.     Comments: Cranial nerves II through XII intact.  Speech clear, mentating well.  Follows commands appropriately.     ED Results / Procedures / Treatments   Labs (all labs ordered are listed, but only abnormal results are displayed) Labs Reviewed  BASIC METABOLIC PANEL - Abnormal; Notable for the following components:      Result Value   Sodium 115 (*)    Potassium 2.8 (*)    Chloride 70 (*)    Glucose, Bld 127 (*)    BUN 92 (*)    Creatinine, Ser 2.54 (*)    GFR, Estimated 30 (*)    Anion gap 19 (*)    All other components within normal limits  LACTIC ACID, PLASMA - Abnormal; Notable for the following components:   Lactic Acid, Venous 2.2 (*)    All other components within normal limits  CBC WITH DIFFERENTIAL/PLATELET - Abnormal; Notable for the following components:   WBC 13.3 (*)    Hemoglobin 18.2 (*)    MCHC 36.1 (*)    RDW 11.2 (*)    Neutro Abs 9.1 (*)    Monocytes Absolute 1.4 (*)    All other components within normal limits  CK - Abnormal; Notable for the following components:   Total CK 484 (*)    All other components within normal limits  MAGNESIUM - Abnormal; Notable for the following components:   Magnesium 2.8 (*)    All other components within normal limits   RESP PANEL BY RT-PCR (FLU A&B, COVID) [id number]  LACTIC ACID, PLASMA  RAPID URINE DRUG SCREEN, HOSP PERFORMED  BASIC METABOLIC PANEL    EKG EKG Interpretation  Date/Time:  Thursday February 11 2022 09:55:09 EDT Ventricular Rate:  72 PR Interval:  170 QRS Duration: 95 QT Interval:  551 QTC Calculation: 604 R Axis:   80 Text Interpretation: Sinus rhythm RAE, consider biatrial enlargement Baseline wander Confirmed by Benjiman Core [phone]) on 02/11/2022 1:01:30 PM  Radiology CT Head Wo Contrast  Result Date: 02/11/2022 CLINICAL DATA:  Confusion and possible seizure after unwitnessed fall last night. Bruising to the right eye. EXAM: CT HEAD WITHOUT CONTRAST CT MAXILLOFACIAL WITHOUT CONTRAST CT CERVICAL SPINE WITHOUT CONTRAST TECHNIQUE: Multidetector CT imaging of the head, cervical spine, and maxillofacial structures were performed using the standard protocol without intravenous contrast. Multiplanar CT image reconstructions of the cervical spine and maxillofacial structures were also generated. RADIATION DOSE REDUCTION: This exam was performed according to the departmental dose-optimization program which includes automated exposure control, adjustment of the mA and/or kV according to patient size and/or use of iterative reconstruction technique. COMPARISON:  CT maxillofacial dated July 31, 2018. FINDINGS: CT HEAD FINDINGS Brain: No evidence of acute infarction, hemorrhage, hydrocephalus, extra-axial collection or mass lesion/mass effect. Mild generalized cerebral atrophy. Vascular: No hyperdense vessel or unexpected calcification. Skull: Normal. Negative for fracture or focal lesion. Other: None. CT MAXILLOFACIAL FINDINGS Osseous: No acute fracture or mandibular dislocation. No destructive process. Orbits: Negative. No traumatic or inflammatory finding. Sinuses: Small retention cysts in both maxillary sinuses. Otherwise clear. Soft tissues: Small right periorbital hematoma. CT CERVICAL SPINE  FINDINGS Alignment: Straightening of the normal cervical lordosis. No traumatic malalignment. Skull base and vertebrae: No acute fracture. No primary bone lesion or focal pathologic process. Soft tissues and spinal canal: No prevertebral fluid or swelling. No visible canal hematoma. Disc levels: Mild-to-moderate multilevel disc height loss and facet uncovertebral hypertrophy throughout the cervical spine. Upper chest: Negative. Other: 9 mm hypodense nodule in the left thyroid lobe. Not clinically significant; no follow-up imaging recommended (ref: J Am Coll Radiol. 2015 Feb;12(2): 143-50). IMPRESSION: 1. No acute intracranial abnormality. Small right periorbital hematoma. 2. No acute facial fracture. 3. No acute cervical spine fracture or traumatic listhesis. Electronically Signed   By: Obie Dredge M.D.   On: 02/11/2022 11:03   CT Maxillofacial Wo Contrast  Result Date: 02/11/2022 CLINICAL DATA:  Confusion and possible seizure after unwitnessed fall last night. Bruising to the right eye. EXAM: CT HEAD WITHOUT CONTRAST CT MAXILLOFACIAL WITHOUT CONTRAST CT CERVICAL SPINE WITHOUT CONTRAST TECHNIQUE: Multidetector CT imaging of the head, cervical spine, and maxillofacial structures were performed using the standard protocol without intravenous contrast. Multiplanar CT image reconstructions of the cervical spine and maxillofacial structures were also generated. RADIATION DOSE REDUCTION: This exam was performed according to the departmental dose-optimization program which includes automated exposure control, adjustment of the mA and/or kV according to patient size and/or use of iterative reconstruction technique. COMPARISON:  CT maxillofacial dated July 31, 2018. FINDINGS: CT HEAD FINDINGS Brain: No evidence of acute infarction, hemorrhage, hydrocephalus, extra-axial collection or mass lesion/mass effect. Mild generalized cerebral atrophy. Vascular: No hyperdense vessel or unexpected calcification. Skull:  Normal. Negative for fracture or focal lesion. Other: None. CT MAXILLOFACIAL FINDINGS Osseous: No acute fracture or mandibular dislocation. No destructive process. Orbits: Negative. No traumatic or inflammatory finding. Sinuses: Small retention cysts in both maxillary sinuses. Otherwise clear. Soft tissues: Small right periorbital hematoma. CT CERVICAL SPINE FINDINGS Alignment: Straightening of the normal cervical lordosis. No traumatic malalignment. Skull base and vertebrae: No acute fracture. No primary bone lesion or focal pathologic process. Soft tissues and spinal canal: No prevertebral fluid or swelling. No visible canal hematoma. Disc levels: Mild-to-moderate multilevel disc height  loss and facet uncovertebral hypertrophy throughout the cervical spine. Upper chest: Negative. Other: 9 mm hypodense nodule in the left thyroid lobe. Not clinically significant; no follow-up imaging recommended (ref: J Am Coll Radiol. 2015 Feb;12(2): 143-50). IMPRESSION: 1. No acute intracranial abnormality. Small right periorbital hematoma. 2. No acute facial fracture. 3. No acute cervical spine fracture or traumatic listhesis. Electronically Signed   By: Obie Dredge M.D.   On: 02/11/2022 11:03   CT Cervical Spine Wo Contrast  Result Date: 02/11/2022 CLINICAL DATA:  Confusion and possible seizure after unwitnessed fall last night. Bruising to the right eye. EXAM: CT HEAD WITHOUT CONTRAST CT MAXILLOFACIAL WITHOUT CONTRAST CT CERVICAL SPINE WITHOUT CONTRAST TECHNIQUE: Multidetector CT imaging of the head, cervical spine, and maxillofacial structures were performed using the standard protocol without intravenous contrast. Multiplanar CT image reconstructions of the cervical spine and maxillofacial structures were also generated. RADIATION DOSE REDUCTION: This exam was performed according to the departmental dose-optimization program which includes automated exposure control, adjustment of the mA and/or kV according to patient  size and/or use of iterative reconstruction technique. COMPARISON:  CT maxillofacial dated July 31, 2018. FINDINGS: CT HEAD FINDINGS Brain: No evidence of acute infarction, hemorrhage, hydrocephalus, extra-axial collection or mass lesion/mass effect. Mild generalized cerebral atrophy. Vascular: No hyperdense vessel or unexpected calcification. Skull: Normal. Negative for fracture or focal lesion. Other: None. CT MAXILLOFACIAL FINDINGS Osseous: No acute fracture or mandibular dislocation. No destructive process. Orbits: Negative. No traumatic or inflammatory finding. Sinuses: Small retention cysts in both maxillary sinuses. Otherwise clear. Soft tissues: Small right periorbital hematoma. CT CERVICAL SPINE FINDINGS Alignment: Straightening of the normal cervical lordosis. No traumatic malalignment. Skull base and vertebrae: No acute fracture. No primary bone lesion or focal pathologic process. Soft tissues and spinal canal: No prevertebral fluid or swelling. No visible canal hematoma. Disc levels: Mild-to-moderate multilevel disc height loss and facet uncovertebral hypertrophy throughout the cervical spine. Upper chest: Negative. Other: 9 mm hypodense nodule in the left thyroid lobe. Not clinically significant; no follow-up imaging recommended (ref: J Am Coll Radiol. 2015 Feb;12(2): 143-50). IMPRESSION: 1. No acute intracranial abnormality. Small right periorbital hematoma. 2. No acute facial fracture. 3. No acute cervical spine fracture or traumatic listhesis. Electronically Signed   By: Obie Dredge M.D.   On: 02/11/2022 11:03   DG Chest 2 View  Result Date: 02/11/2022 CLINICAL DATA:  Syncope, fall, weakness EXAM: CHEST - 2 VIEW COMPARISON:  None Available. FINDINGS: The heart size and mediastinal contours are within normal limits. Both lungs are clear. The visualized skeletal structures are unremarkable. Trachea midline. No acute osseous finding. Skin fold noted over the left apex. Minor basilar  atelectasis versus scarring. IMPRESSION: No active cardiopulmonary disease. Electronically Signed   By: Judie Petit.  Shick M.D.   On: 02/11/2022 10:56    Procedures Procedures    Medications Ordered in ED Medications  sodium chloride 0.9 % bolus 1,000 mL (has no administration in time range)    ED Course/ Medical Decision Making/ A&P                           Medical Decision Making Patient here from local correctional facility accompanied by officer.  Brought in for evaluation of unwitnessed syncopal episode and fall.  Patient reports dizziness for several days and frequent falls.  Reported unwitnessed fall last evening with swelling tenderness and bruising of the right periorbital area.  Patient states he feels very weak and dehydrated.  Per  officer, patient was having 10 to 15-second episode of tensing of his body when placed in the car prior to ER arrival.  No reported history of seizures.  Pt' complaint requires extensive work up and comes with high risk of complications and morbidity.  Diff dx at this time includes dehydration, sepsis, seizures, intracranial injury  On exam, patient ill-appearing mucous membranes are very dry.  He does have tenderness and bruising of the right periorbital area.  No other facial tenderness, no evidence of lip or tongue biting.  He does not appear postictal.  He is mentating well.  Blood pressures are soft, likely dehydrated.  Rectal temp low, Bair hugger applied.  Will obtain labs CT images urinalysis.  Amount and/or Complexity of Data Reviewed Labs: ordered.    Details: Labs interpreted by me, mild leukocytosis with white count of 13,000, chemistries shows significant hyponatremia with sodium of 115, potassium 2.8, AKI with BUN of 92 serum creatinine 2.54.  No recent laboratory values for comparison.  Total CK4 84 and magnesium of 2.8.  Urinalysis and UDS results are pending Radiology: ordered.    Details: CT imaging of the head, maxillofacial and C-spine  without acute bony injury.  Chest x-ray without acute cardiopulmonary process ECG/medicine tests: ordered. Discussion of management or test interpretation with external provider(s): On recheck, BP remain low despite initial IVF's will try second liter.   BP now improving.  Systolic now in the 90's   Discussed findings with nephrology, Dr. Thedore Mins who recommends repeat BMP to see how sodium is trending after IVF's, if sodium continuing to decrease he will need ICU admission and he will see him later today.    Patient with dehydration, hyponatremia AKI.  He appears critically ill, will need hospital admission.  Discussed findings with [facility] hospitalist, Dr. Kerry Hough who agrees to admit.    Critical Care Total time providing critical care: 40 minutes   CRITICAL CARE Performed by: Laurin Paulo Total critical care time: 40 minutes Critical care time was exclusive of separately billable procedures and treating other patients. Critical care was necessary to treat or prevent imminent or life-threatening deterioration. Critical care was time spent personally by me on the following activities: development of treatment plan with patient and/or surrogate as well as nursing, discussions with consultants, evaluation of patient's response to treatment, examination of patient, obtaining history from patient or surrogate, ordering and performing treatments and interventions, ordering and review of laboratory studies, ordering and review of radiographic studies, pulse oximetry and re-evaluation of patient's condition.         Final Clinical Impression(s) / ED Diagnoses Final diagnoses:  Hyponatremia    Rx / DC Orders ED Discharge Orders     None         Pauline Aus, PA-C 02/11/22 1510    Benjiman Core, MD 02/12/22 [phone]

## 2022-02-11 NOTE — ED Triage Notes (Signed)
Pt brought to ED via [facility] from [place]. Pt had an unwitnessed fall throughout night, unsure LOC. Pt with bruising to right eye. Per deputy, was called to cell and placed pt in car to bring to hospital, pt started to stiffen and had episode in which nurse thought was a seizure, lasted approximately 10-12 seconds then patient came back around and did not have any confusion. Pt states he has been having difficulty walking for a couple of days.

## 2022-02-12 ENCOUNTER — Inpatient Hospital Stay (HOSPITAL_COMMUNITY)
Admit: 2022-02-12 | Discharge: 2022-02-12 | Disposition: A | Discharge status: Home / Self Care | Attending: Family Medicine | Admitting: Family Medicine

## 2022-02-12 ENCOUNTER — Inpatient Hospital Stay (HOSPITAL_COMMUNITY)

## 2022-02-12 DIAGNOSIS — K56609 Unspecified intestinal obstruction, unspecified as to partial versus complete obstruction: Secondary | ICD-10-CM

## 2022-02-12 DIAGNOSIS — E782 Mixed hyperlipidemia: Secondary | ICD-10-CM

## 2022-02-12 DIAGNOSIS — E871 Hypo-osmolality and hyponatremia: Secondary | ICD-10-CM | POA: Diagnosis not present

## 2022-02-12 DIAGNOSIS — E860 Dehydration: Secondary | ICD-10-CM | POA: Diagnosis not present

## 2022-02-12 DIAGNOSIS — R55 Syncope and collapse: Secondary | ICD-10-CM

## 2022-02-12 DIAGNOSIS — E876 Hypokalemia: Secondary | ICD-10-CM | POA: Diagnosis present

## 2022-02-12 DIAGNOSIS — N179 Acute kidney failure, unspecified: Secondary | ICD-10-CM | POA: Diagnosis not present

## 2022-02-12 LAB — CBC
HCT: 45.8 % (ref 39.0–52.0)
Hemoglobin: 17 g/dL (ref 13.0–17.0)
MCH: 32.5 pg (ref 26.0–34.0)
MCHC: 37.1 g/dL — ABNORMAL HIGH (ref 30.0–36.0)
MCV: 87.6 fL (ref 80.0–100.0)
Platelets: 237 10*3/uL (ref 150–400)
RBC: 5.23 MIL/uL (ref 4.22–5.81)
RDW: 11.1 % — ABNORMAL LOW (ref 11.5–15.5)
WBC: 15.1 10*3/uL — ABNORMAL HIGH (ref 4.0–10.5)
nRBC: 0 % (ref 0.0–0.2)

## 2022-02-12 LAB — SODIUM
Sodium: 121 mmol/L — ABNORMAL LOW (ref 135–145)
Sodium: 122 mmol/L — ABNORMAL LOW (ref 135–145)
Sodium: 122 mmol/L — ABNORMAL LOW (ref 135–145)
Sodium: 123 mmol/L — ABNORMAL LOW (ref 135–145)
Sodium: 125 mmol/L — ABNORMAL LOW (ref 135–145)

## 2022-02-12 LAB — COMPREHENSIVE METABOLIC PANEL
ALT: 16 U/L (ref 0–44)
AST: 19 U/L (ref 15–41)
Albumin: 3.3 g/dL — ABNORMAL LOW (ref 3.5–5.0)
Alkaline Phosphatase: 52 U/L (ref 38–126)
Anion gap: 12 (ref 5–15)
BUN: 57 mg/dL — ABNORMAL HIGH (ref 6–20)
CO2: 25 mmol/L (ref 22–32)
Calcium: 8.3 mg/dL — ABNORMAL LOW (ref 8.9–10.3)
Chloride: 84 mmol/L — ABNORMAL LOW (ref 98–111)
Creatinine, Ser: 1.51 mg/dL — ABNORMAL HIGH (ref 0.61–1.24)
GFR, Estimated: 55 mL/min — ABNORMAL LOW (ref >60)
Glucose, Bld: 101 mg/dL — ABNORMAL HIGH (ref 70–99)
Potassium: 2.9 mmol/L — ABNORMAL LOW (ref 3.5–5.1)
Sodium: 121 mmol/L — ABNORMAL LOW (ref 135–145)
Total Bilirubin: 1.8 mg/dL — ABNORMAL HIGH (ref 0.3–1.2)
Total Protein: 6.1 g/dL — ABNORMAL LOW (ref 6.5–8.1)

## 2022-02-12 LAB — MAGNESIUM: Magnesium: 2.4 mg/dL (ref 1.7–2.4)

## 2022-02-12 LAB — MRSA NEXT GEN BY PCR, NASAL: MRSA by PCR Next Gen: NOT DETECTED

## 2022-02-12 MED ORDER — SODIUM CHLORIDE 0.9 % IV BOLUS
500.0000 mL | Freq: Once | INTRAVENOUS | Status: AC
  Administered 2022-02-12: 500 mL via INTRAVENOUS

## 2022-02-12 MED ORDER — POTASSIUM CHLORIDE 10 MEQ/100ML IV SOLN
10.0000 meq | INTRAVENOUS | Status: AC
  Administered 2022-02-12 (×4): 10 meq via INTRAVENOUS
  Filled 2022-02-12 (×4): qty 100

## 2022-02-12 MED ORDER — SODIUM CHLORIDE 0.9 % IV BOLUS
500.0000 mL | Freq: Once | INTRAVENOUS | Status: AC
Start: 2022-02-12 — End: 2022-02-12
  Administered 2022-02-12: 500 mL via INTRAVENOUS

## 2022-02-12 MED ORDER — IOHEXOL 300 MG/ML  SOLN
100.0000 mL | Freq: Once | INTRAMUSCULAR | Status: AC | PRN
Start: 2022-02-12 — End: 2022-02-12
  Administered 2022-02-12: 100 mL via INTRAVENOUS

## 2022-02-12 MED ORDER — IOHEXOL 9 MG/ML PO SOLN
ORAL | Status: AC
  Filled 2022-02-12: qty 1000

## 2022-02-12 MED ORDER — NOREPINEPHRINE 4 MG/250ML-% IV SOLN
2.0000 ug/min | INTRAVENOUS | Status: DC
  Administered 2022-02-12: 5 ug/min via INTRAVENOUS
  Administered 2022-02-13: 6 ug/min via INTRAVENOUS
  Filled 2022-02-12 (×2): qty 250

## 2022-02-12 MED ORDER — LORAZEPAM 2 MG/ML IJ SOLN
2.0000 mg | INTRAMUSCULAR | Status: DC | PRN
  Administered 2022-02-12: 2 mg via INTRAVENOUS
  Filled 2022-02-12: qty 1

## 2022-02-12 MED ORDER — SODIUM CHLORIDE 0.9 % IV SOLN: 250.0000 mL | INTRAVENOUS | Status: DC

## 2022-02-12 NOTE — Progress Notes (Signed)
[facility]  CT with bowel obstruction at umbilical hernia. Was able to get most of the hernia reduced patient. Tolerated it well. Labs are improving. Will talk with Anesthesia tomorrow may do an umbilical hernia repair to prevent this from incarcerating again as it sounds like he's been dealing with something off and on for the last month.  NPO at midnight  Algis Greenhouse, [place]

## 2022-02-12 NOTE — Plan of Care (Signed)

## 2022-02-12 NOTE — Progress Notes (Signed)
  Transition of Care [facility]) Screening Note   Patient Details  Name: Logan Garrett Date of Birth: 1971-03-13   Transition of Care [facility]) CM/SW Contact:    Villa Herb, LCSWA Phone Number: 02/12/2022, 11:41 AM    Transition of Care Department [facility]) has reviewed patient and no TOC needs have been identified at this time. We will continue to monitor patient advancement through interdisciplinary progression rounds. If new patient transition needs arise, please place a TOC consult.

## 2022-02-12 NOTE — Consult Note (Signed)
[facility] Consult  Reason for Consult: ? SBO  Referring Physician:  Dr. Kerry HoughMemon  Chief Complaint   Fall     HPI: Logan Garrett is a 51 y.o. male with HTN, HLD, who had a unwitnessed fall as an inmate at a correctional facility. He was dizzy on standing and fell. He was found to have hyponatremia and AKI in the ED as well as having a lactic acidosis. His BP and lactic acidosis improved with fluids. He had a large emesis the night he was admitted and KUB demonstrated dilated small bowel loops. The night hospitalist attempted to get NG but he had prior broken nose and neither passage worked per report.    The patient says he has not had a BM in over 1 week. He has never had any abdominal surgeries. He has had issues with abdominal pain and vomiting for the past month.   Past Medical History:  Diagnosis Date   Depression    High cholesterol    History of kidney stones    Hyperlipidemia    Hypertension     Past Surgical History:  Procedure Laterality Date   CLOSED REDUCTION NASAL FRACTURE Bilateral 08/10/2018   Procedure: Closed nasal fracture and septal fracture reduction  Internal and external splinting;  Surgeon: Peggye Formillingham, Claire S, DO;  Location: [facility];  Service: Plastics;  Laterality: Bilateral;   ORIF WRIST FRACTURE Left 07/31/2018   Procedure: OPEN REDUCTION INTERNAL FIXATION (ORIF) LEFT PERILUNATE DISLOCATION;  Surgeon: Dairl PonderWeingold, Matthew, MD;  Location: [facility] OR;  Service: Orthopedics;  Laterality: Left;    No family history on file.  Social History   Tobacco Use   Smoking status: Never   Smokeless tobacco: Never  Vaping Use   Vaping Use: Never used  Substance Use Topics   Alcohol use: Yes    Comment: occ    Drug use: No    Medications: I have reviewed the patient's current medications. Prior to Admission:  Medications Prior to Admission  Medication Sig Dispense Refill Last Dose   acetaminophen (TYLENOL) 325 MG tablet Take 650  mg by mouth every 6 (six) hours as needed for mild pain, headache or fever.   unk   atorvastatin (LIPITOR) 20 MG tablet Take 20 mg by mouth daily.   02/10/2022   lisinopril (ZESTRIL) 20 MG tablet Take 20 mg by mouth daily.   02/11/2022   Scheduled:  Chlorhexidine Gluconate Cloth  6 each Topical Daily   heparin  5,000 Units Subcutaneous Q8H   Continuous:  sodium chloride 75 mL/hr at 02/12/22 1243   sodium chloride Stopped (02/12/22 1801)   norepinephrine (LEVOPHED) Adult infusion 6 mcg/min (02/12/22 1819)   ZOX:[id number]:acetaminophen **OR** acetaminophen, LORazepam, ondansetron **OR** ondansetron (ZOFRAN) IV  No Known Allergies   ROS:  A comprehensive review of systems was negative except for: Eyes: positive for right eye bruising Gastrointestinal: positive for abdominal pain and constipation  Blood pressure (!) 93/49, pulse 73, temperature 99.2 F (37.3 C), temperature source Axillary, resp. rate 13, height 6\' 4"  (1.93 m), weight 79.8 kg, SpO2 97 %. Physical Exam Vitals reviewed.  HENT:     Head:     Comments: Right eye perioribital bruising and edema    Nose: Nose normal.  Cardiovascular:     Rate and Rhythm: Normal rate.  Pulmonary:     Effort: Pulmonary effort is normal.  Abdominal:     General: There is distension.     Palpations: Abdomen is soft.  Tenderness: There is abdominal tenderness.  Musculoskeletal:        General: No swelling.  Skin:    General: Skin is warm.  Neurological:     General: No focal deficit present.     Mental Status: He is alert.  Psychiatric:        Mood and Affect: Mood normal.     Results: Results for orders placed or performed during the hospital encounter of 02/11/22 (from the past 48 hour(s))  Basic metabolic panel     Status: Abnormal   Collection Time: 02/11/22  9:52 AM  Result Value Ref Range   Sodium 115 (LL) 135 - 145 mmol/L    Comment: CRITICAL RESULT CALLED TO, READ BACK BY AND VERIFIED WITH: WALL,Y AT 12:20AM ON 02/11/22 BY  FESTERMAN,C    Potassium 2.8 (L) 3.5 - 5.1 mmol/L   Chloride 70 (L) 98 - 111 mmol/L   CO2 26 22 - 32 mmol/L   Glucose, Bld 127 (H) 70 - 99 mg/dL    Comment: Glucose reference range applies only to samples taken after fasting for at least 8 hours.   BUN 92 (H) 6 - 20 mg/dL   Creatinine, Ser 1.61 (H) 0.61 - 1.24 mg/dL   Calcium 9.0 8.9 - 09.6 mg/dL   GFR, Estimated 30 (L) >60 mL/min    Comment: (NOTE) Calculated using the CKD-EPI Creatinine Equation (2021)    Anion gap 19 (H) 5 - 15    Comment: Performed at [facility], [address], [place], [place] [address]  Lactic acid, plasma     Status: Abnormal   Collection Time: 02/11/22  9:52 AM  Result Value Ref Range   Lactic Acid, Venous 2.2 (HH) 0.5 - 1.9 mmol/L    Comment: CRITICAL RESULT CALLED TO, READ BACK BY AND VERIFIED WITH: WALL,Y AT 10:20AM ON 02/11/22 BY [place] Performed at [facility], [address], [place], [place] [address]   CBC with Differential     Status: Abnormal   Collection Time: 02/11/22  9:52 AM  Result Value Ref Range   WBC 13.3 (H) 4.0 - 10.5 K/uL   RBC 5.81 4.22 - 5.81 MIL/uL   Hemoglobin 18.2 (H) 13.0 - 17.0 g/dL   HCT 14.7 82.9 - 56.2 %   MCV 86.7 80.0 - 100.0 fL   MCH 31.3 26.0 - 34.0 pg   MCHC 36.1 (H) 30.0 - 36.0 g/dL   RDW 13.0 (L) 86.5 - 78.4 %   Platelets 329 150 - 400 K/uL   nRBC 0.0 0.0 - 0.2 %   Neutrophils Relative % 71 %   Neutro Abs 9.1 (H) 1.7 - 7.7 K/uL   Lymphocytes Relative 15 %   Lymphs Abs 2.1 0.7 - 4.0 K/uL   Monocytes Relative 12 %   Monocytes Absolute 1.4 (H) 0.1 - 1.0 K/uL   Eosinophils Relative 1 %   Eosinophils Absolute 0.0 0.0 - 0.5 K/uL   Basophils Relative 0 %   Basophils Absolute 0.0 0.0 - 0.1 K/uL   WBC Morphology MORPHOLOGY UNREMARKABLE    RBC Morphology See Note     Comment: CONFIRMED BY MANUAL MORPHOLOGY UNREMARKABLE    Smear Review PLATELET COUNT CONFIRMED BY SMEAR    Immature Granulocytes 1 %   Abs Immature Granulocytes 0.07 0.00 - 0.07 K/uL    Acanthocytes PRESENT    Tear Drop Cells PRESENT    Ovalocytes PRESENT    Stomatocytes PRESENT     Comment: Performed at [facility], [address] [place], [place] [address]  Resp Panel by RT-PCR (Flu A&B, Covid) Anterior Nasal Swab     Status: None   Collection Time: 02/11/22  9:52 AM   Specimen: Anterior Nasal Swab  Result Value Ref Range   SARS Coronavirus 2 by RT PCR NEGATIVE NEGATIVE    Comment: (NOTE) SARS-CoV-2 target nucleic acids are NOT DETECTED.  The SARS-CoV-2 RNA is generally detectable in upper respiratory specimens during the acute phase of infection. The lowest concentration of SARS-CoV-2 viral copies this assay can detect is 138 copies/mL. A negative result does not preclude SARS-Cov-2 infection and should not be used as the sole basis for treatment or other patient management decisions. A negative result may occur with  improper specimen collection/handling, submission of specimen other than nasopharyngeal swab, presence of viral mutation(s) within the areas targeted by this assay, and inadequate number of viral copies(<138 copies/mL). A negative result must be combined with clinical observations, patient history, and epidemiological information. The expected result is Negative.  Fact Sheet for Patients:  BloggerCourse.com  Fact Sheet for Healthcare Providers:  SeriousBroker.it  This test is no t yet approved or cleared by the [place] FDA and  has been authorized for detection and/or diagnosis of SARS-CoV-2 by FDA under an Emergency Use Authorization (EUA). This EUA will remain  in effect (meaning this test can be used) for the duration of the COVID-19 declaration under Section 564(b)(1) of the Act, 21 U.S.C.section 360bbb-3(b)(1), unless the authorization is terminated  or revoked sooner.       Influenza A by PCR NEGATIVE NEGATIVE   Influenza B by PCR NEGATIVE NEGATIVE    Comment: (NOTE) The Xpert  Xpress SARS-CoV-2/FLU/RSV plus assay is intended as an aid in the diagnosis of influenza from Nasopharyngeal swab specimens and should not be used as a sole basis for treatment. Nasal washings and aspirates are unacceptable for Xpert Xpress SARS-CoV-2/FLU/RSV testing.  Fact Sheet for Patients: BloggerCourse.com  Fact Sheet for Healthcare Providers: SeriousBroker.it  This test is not yet approved or cleared by the [place] FDA and has been authorized for detection and/or diagnosis of SARS-CoV-2 by FDA under an Emergency Use Authorization (EUA). This EUA will remain in effect (meaning this test can be used) for the duration of the COVID-19 declaration under Section 564(b)(1) of the Act, 21 U.S.C. section 360bbb-3(b)(1), unless the authorization is terminated or revoked.  Performed at [facility], [address] [place], [place] [address]   CK     Status: Abnormal   Collection Time: 02/11/22  9:52 AM  Result Value Ref Range   Total CK 484 (H) 49 - 397 U/L    Comment: Performed at [facility], [address], [place], [place] [address]  Magnesium     Status: Abnormal   Collection Time: 02/11/22  9:52 AM  Result Value Ref Range   Magnesium 2.8 (H) 1.7 - 2.4 mg/dL    Comment: Performed at [facility], [address] [place], [place] [address]  Urine rapid drug screen (hosp performed)     Status: None   Collection Time: 02/11/22  9:53 AM  Result Value Ref Range   Opiates NONE DETECTED NONE DETECTED   Cocaine NONE DETECTED NONE DETECTED   Benzodiazepines NONE DETECTED NONE DETECTED   Amphetamines NONE DETECTED NONE DETECTED   Tetrahydrocannabinol NONE DETECTED NONE DETECTED   Barbiturates NONE DETECTED NONE DETECTED    Comment: (NOTE) DRUG SCREEN FOR MEDICAL PURPOSES ONLY.  IF CONFIRMATION IS NEEDED FOR ANY PURPOSE, NOTIFY LAB  WITHIN 5 DAYS.  LOWEST DETECTABLE LIMITS FOR URINE DRUG SCREEN Drug Class                      Cutoff (ng/mL) Amphetamine and metabolites    1000 Barbiturate and metabolites    200 Benzodiazepine                 200 Tricyclics and metabolites     300 Opiates and metabolites        300 Cocaine and metabolites        300 THC                            50 Performed at [facility], [address], [place], [place] [address]   Lactic acid, plasma     Status: None   Collection Time: 02/11/22 11:17 AM  Result Value Ref Range   Lactic Acid, Venous 1.5 0.5 - 1.9 mmol/L    Comment: Performed at [facility], [address], [place], [place] [address]  Osmolality     Status: None   Collection Time: 02/11/22 11:17 AM  Result Value Ref Range   Osmolality 275 275 - 295 mOsm/kg    Comment: Performed at [facility], [address] [place], [place] [address]  TSH     Status: None   Collection Time: 02/11/22 11:17 AM  Result Value Ref Range   TSH 0.729 0.350 - 4.500 uIU/mL    Comment: Performed by a 3rd Generation assay with a functional sensitivity of <=0.01 uIU/mL. Performed at [facility], [address], [place], [place] [address]   Basic metabolic panel     Status: Abnormal   Collection Time: 02/11/22 12:18 PM  Result Value Ref Range   Sodium 117 (LL) 135 - 145 mmol/L    Comment: CRITICAL RESULT CALLED TO, READ BACK BY AND VERIFIED WITH: L LONG RN [id number] K FORSYTH    Potassium 3.0 (L) 3.5 - 5.1 mmol/L   Chloride 77 (L) 98 - 111 mmol/L   CO2 25 22 - 32 mmol/L   Glucose, Bld 104 (H) 70 - 99 mg/dL    Comment: Glucose reference range applies only to samples taken after fasting for at least 8 hours.   BUN 87 (H) 6 - 20 mg/dL   Creatinine, Ser 3.76 (H) 0.61 - 1.24 mg/dL   Calcium 8.1 (L) 8.9 - 10.3 mg/dL   GFR, Estimated 38 (L) >60 mL/min    Comment: (NOTE) Calculated using the CKD-EPI Creatinine Equation (2021)    Anion gap 15 5 - 15    Comment: Performed at [facility], [address] [place], [place] [address]  Osmolality, urine     Status: None   Collection Time:  02/11/22  1:11 PM  Result Value Ref Range   Osmolality, Ur 510 300 - 900 mOsm/kg    Comment: Performed at [facility], [address] [place], [place] [address]  Sodium, urine, random     Status: None   Collection Time: 02/11/22  1:11 PM  Result Value Ref Range   Sodium, Ur 15 mmol/L    Comment: Performed at [facility], [address], [place], [place] [address]  Urinalysis, Routine w reflex microscopic Urine, Catheterized     Status: Abnormal   Collection Time: 02/11/22  1:11 PM  Result Value Ref Range   Color, Urine YELLOW YELLOW   APPearance CLEAR CLEAR  Specific Gravity, Urine 1.013 1.005 - 1.030   pH 5.0 5.0 - 8.0   Glucose, UA NEGATIVE NEGATIVE mg/dL   Hgb urine dipstick MODERATE (A) NEGATIVE   Bilirubin Urine NEGATIVE NEGATIVE   Ketones, ur 20 (A) NEGATIVE mg/dL   Protein, ur NEGATIVE NEGATIVE mg/dL   Nitrite NEGATIVE NEGATIVE   Leukocytes,Ua NEGATIVE NEGATIVE   RBC / HPF 0-5 0 - 5 RBC/hpf   WBC, UA 0-5 0 - 5 WBC/hpf   Bacteria, UA NONE SEEN NONE SEEN   Mucus PRESENT     Comment: Performed at [facility], [address], [place], [place] [address]  MRSA Next Gen by PCR, Nasal     Status: None   Collection Time: 02/11/22  6:30 PM   Specimen: Nasal Mucosa; Nasal Swab  Result Value Ref Range   MRSA by PCR Next Gen NOT DETECTED NOT DETECTED    Comment: (NOTE) The GeneXpert MRSA Assay (FDA approved for NASAL specimens only), is one component of a comprehensive MRSA colonization surveillance program. It is not intended to diagnose MRSA infection nor to guide or monitor treatment for MRSA infections. Test performance is not FDA approved in patients less than 68 years old. Performed at [facility], [address] [place], [place] [address]   HIV Antibody (routine testing w rflx)     Status: None   Collection Time: 02/11/22  6:51 PM  Result Value Ref Range   HIV Screen 4th Generation wRfx Non Reactive Non Reactive    Comment: Performed at [facility],  [address], [place], [place] [address]  Sodium     Status: Abnormal   Collection Time: 02/11/22  6:51 PM  Result Value Ref Range   Sodium 115 (LL) 135 - 145 mmol/L    Comment: CRITICAL RESULT CALLED TO, READ BACK BY AND VERIFIED WITH: ALLEN,T ON 02/11/22 AT 2005 BY LOY,C Performed at [facility], [address] [place], [place] [address]   Sodium     Status: Abnormal   Collection Time: 02/11/22 11:13 PM  Result Value Ref Range   Sodium 121 (L) 135 - 145 mmol/L    Comment: DELTA CHECK NOTED Performed at [facility], [address] [place], [place] [address]   Sodium     Status: Abnormal   Collection Time: 02/12/22  3:26 AM  Result Value Ref Range   Sodium 122 (L) 135 - 145 mmol/L    Comment: Performed at [facility], [address] [place], [place] [address]  CBC     Status: Abnormal   Collection Time: 02/12/22  3:58 AM  Result Value Ref Range   WBC 15.1 (H) 4.0 - 10.5 K/uL   RBC 5.23 4.22 - 5.81 MIL/uL   Hemoglobin 17.0 13.0 - 17.0 g/dL   HCT 57.8 46.9 - 62.9 %   MCV 87.6 80.0 - 100.0 fL   MCH 32.5 26.0 - 34.0 pg   MCHC 37.1 (H) 30.0 - 36.0 g/dL   RDW 52.8 (L) 41.3 - 24.4 %   Platelets 237 150 - 400 K/uL   nRBC 0.0 0.0 - 0.2 %    Comment: Performed at [facility], [address], [place], [place] [address]  Comprehensive metabolic panel     Status: Abnormal   Collection Time: 02/12/22  3:58 AM  Result Value Ref Range   Sodium 121 (L) 135 - 145 mmol/L   Potassium 2.9 (L) 3.5 - 5.1 mmol/L   Chloride 84 (L) 98 - 111 mmol/L  CO2 25 22 - 32 mmol/L   Glucose, Bld 101 (H) 70 - 99 mg/dL    Comment: Glucose reference range applies only to samples taken after fasting for at least 8 hours.   BUN 57 (H) 6 - 20 mg/dL   Creatinine, Ser 9.56 (H) 0.61 - 1.24 mg/dL   Calcium 8.3 (L) 8.9 - 10.3 mg/dL   Total Protein 6.1 (L) 6.5 - 8.1 g/dL   Albumin 3.3 (L) 3.5 - 5.0 g/dL   AST 19 15 - 41 U/L   ALT 16 0 - 44 U/L   Alkaline Phosphatase 52 38 - 126 U/L   Total Bilirubin 1.8 (H) 0.3 -  1.2 mg/dL   GFR, Estimated 55 (L) >60 mL/min    Comment: (NOTE) Calculated using the CKD-EPI Creatinine Equation (2021)    Anion gap 12 5 - 15    Comment: Performed at [facility], [address] [place], [place] [address]  Magnesium     Status: None   Collection Time: 02/12/22  3:58 AM  Result Value Ref Range   Magnesium 2.4 1.7 - 2.4 mg/dL    Comment: Performed at [facility], [address] [place], [place] [address]  Sodium     Status: Abnormal   Collection Time: 02/12/22  9:39 AM  Result Value Ref Range   Sodium 122 (L) 135 - 145 mmol/L    Comment: Performed at [facility], [address], [place], [place] [address]  Sodium     Status: Abnormal   Collection Time: 02/12/22  1:47 PM  Result Value Ref Range   Sodium 123 (L) 135 - 145 mmol/L    Comment: Performed at [facility], [address], [place], [place] [address]  Sodium     Status: Abnormal   Collection Time: 02/12/22  5:50 PM  Result Value Ref Range   Sodium 125 (L) 135 - 145 mmol/L    Comment: Performed at [facility], [address] [place], [place] [address]    Personally reviewed KUB- dilated small bowel EEG adult  Result Date: 02/12/2022 Charlsie Quest, MD     02/12/2022  6:27 PM Patient Name: Logan Garrett MRN: [id number] Epilepsy Attending: Charlsie Quest Referring Physician/Provider: Lilyan Gilford, DO Date: 02/12/2022 Duration: 22.35 mins Patient history: 51yo M undergoing eeg to evaluate for seizure Level of alertness: Awake, asleep AEDs during EEG study: None Technical aspects: This EEG study was done with scalp electrodes positioned according to the 10-20 International system of electrode placement. Electrical activity was acquired at a sampling rate of  and reviewed with a high frequency filter of  and a low frequency filter of . EEG data were recorded continuously and digitally stored. Description: The posterior dominant rhythm consists of 8-9 Hz activity of moderate voltage (25-35 uV)  seen predominantly in posterior head regions, symmetric and reactive to eye opening and eye closing. Sleep was characterized by vertex waves, sleep spindles (12 to 14 Hz), maximal frontocentral region. Hyperventilation and photic stimulation were not performed.   IMPRESSION: This study is within normal limits. No seizures or epileptiform discharges were seen throughout the recording. Charlsie Quest   DG Abd 1 View  Result Date: 02/11/2022 CLINICAL DATA:  Abdominal pain. EXAM: ABDOMEN - 1 VIEW COMPARISON:  None Available. FINDINGS: There are dilated air-filled small bowel loops in the upper abdomen measuring up to 5 cm. The stomach is also dilated and air-filled. Minimal colonic gas identified. There is a phlebolith in the left hemipelvis. No  acute fractures are seen. There are degenerative changes of the lower lumbar spine. Visualized lung bases are clear. IMPRESSION: 1. Dilated proximal small bowel and stomach worrisome for small bowel obstruction. Electronically Signed   By: Darliss Cheney M.D.   On: 02/11/2022 23:21   US RENAL  Result Date: 02/11/2022 CLINICAL DATA:  Renal dysfunction EXAM: RENAL / URINARY TRACT ULTRASOUND COMPLETE COMPARISON:  CT abdomen done on 04/13/2012 FINDINGS: Right Kidney: Renal measurements: 11.9 x 7.5 x 6.8 cm = volume: 315.1 mL. There is no hydronephrosis. There is increased cortical echogenicity. Left Kidney: Renal measurements: 12.5 x 6.2 by 6.1 cm = volume: 249.1 mL. There is no hydronephrosis. There is increased cortical echogenicity. Technologist observed 6 mm hyperechoic focus in the upper pole of left kidney. This may suggest nonobstructing calculus or partial volume averaging of renal sinus fat. Bladder: Unremarkable. Other: None. IMPRESSION: There is no hydronephrosis. There is increased cortical echogenicity in the kidneys suggesting possible medical renal disease. 6 mm hyperechoic focus in the left kidney may suggest nonobstructing calculus or partial volume averaging  artifact. Electronically Signed   By: Ernie Avena M.D.   On: 02/11/2022 13:53   CT Head Wo Contrast  Result Date: 02/11/2022 CLINICAL DATA:  Confusion and possible seizure after unwitnessed fall last night. Bruising to the right eye. EXAM: CT HEAD WITHOUT CONTRAST CT MAXILLOFACIAL WITHOUT CONTRAST CT CERVICAL SPINE WITHOUT CONTRAST TECHNIQUE: Multidetector CT imaging of the head, cervical spine, and maxillofacial structures were performed using the standard protocol without intravenous contrast. Multiplanar CT image reconstructions of the cervical spine and maxillofacial structures were also generated. RADIATION DOSE REDUCTION: This exam was performed according to the departmental dose-optimization program which includes automated exposure control, adjustment of the mA and/or kV according to patient size and/or use of iterative reconstruction technique. COMPARISON:  CT maxillofacial dated July 31, 2018. FINDINGS: CT HEAD FINDINGS Brain: No evidence of acute infarction, hemorrhage, hydrocephalus, extra-axial collection or mass lesion/mass effect. Mild generalized cerebral atrophy. Vascular: No hyperdense vessel or unexpected calcification. Skull: Normal. Negative for fracture or focal lesion. Other: None. CT MAXILLOFACIAL FINDINGS Osseous: No acute fracture or mandibular dislocation. No destructive process. Orbits: Negative. No traumatic or inflammatory finding. Sinuses: Small retention cysts in both maxillary sinuses. Otherwise clear. Soft tissues: Small right periorbital hematoma. CT CERVICAL SPINE FINDINGS Alignment: Straightening of the normal cervical lordosis. No traumatic malalignment. Skull base and vertebrae: No acute fracture. No primary bone lesion or focal pathologic process. Soft tissues and spinal canal: No prevertebral fluid or swelling. No visible canal hematoma. Disc levels: Mild-to-moderate multilevel disc height loss and facet uncovertebral hypertrophy throughout the cervical spine.  Upper chest: Negative. Other: 9 mm hypodense nodule in the left thyroid lobe. Not clinically significant; no follow-up imaging recommended (ref: J Am Coll Radiol. 2015 Feb;12(2): 143-50). IMPRESSION: 1. No acute intracranial abnormality. Small right periorbital hematoma. 2. No acute facial fracture. 3. No acute cervical spine fracture or traumatic listhesis. Electronically Signed   By: Obie Dredge M.D.   On: 02/11/2022 11:03   CT Maxillofacial Wo Contrast  Result Date: 02/11/2022 CLINICAL DATA:  Confusion and possible seizure after unwitnessed fall last night. Bruising to the right eye. EXAM: CT HEAD WITHOUT CONTRAST CT MAXILLOFACIAL WITHOUT CONTRAST CT CERVICAL SPINE WITHOUT CONTRAST TECHNIQUE: Multidetector CT imaging of the head, cervical spine, and maxillofacial structures were performed using the standard protocol without intravenous contrast. Multiplanar CT image reconstructions of the cervical spine and maxillofacial structures were also generated. RADIATION DOSE REDUCTION: This exam was performed  according to the departmental dose-optimization program which includes automated exposure control, adjustment of the mA and/or kV according to patient size and/or use of iterative reconstruction technique. COMPARISON:  CT maxillofacial dated July 31, 2018. FINDINGS: CT HEAD FINDINGS Brain: No evidence of acute infarction, hemorrhage, hydrocephalus, extra-axial collection or mass lesion/mass effect. Mild generalized cerebral atrophy. Vascular: No hyperdense vessel or unexpected calcification. Skull: Normal. Negative for fracture or focal lesion. Other: None. CT MAXILLOFACIAL FINDINGS Osseous: No acute fracture or mandibular dislocation. No destructive process. Orbits: Negative. No traumatic or inflammatory finding. Sinuses: Small retention cysts in both maxillary sinuses. Otherwise clear. Soft tissues: Small right periorbital hematoma. CT CERVICAL SPINE FINDINGS Alignment: Straightening of the normal  cervical lordosis. No traumatic malalignment. Skull base and vertebrae: No acute fracture. No primary bone lesion or focal pathologic process. Soft tissues and spinal canal: No prevertebral fluid or swelling. No visible canal hematoma. Disc levels: Mild-to-moderate multilevel disc height loss and facet uncovertebral hypertrophy throughout the cervical spine. Upper chest: Negative. Other: 9 mm hypodense nodule in the left thyroid lobe. Not clinically significant; no follow-up imaging recommended (ref: J Am Coll Radiol. 2015 Feb;12(2): 143-50). IMPRESSION: 1. No acute intracranial abnormality. Small right periorbital hematoma. 2. No acute facial fracture. 3. No acute cervical spine fracture or traumatic listhesis. Electronically Signed   By: Obie Dredge M.D.   On: 02/11/2022 11:03   CT Cervical Spine Wo Contrast  Result Date: 02/11/2022 CLINICAL DATA:  Confusion and possible seizure after unwitnessed fall last night. Bruising to the right eye. EXAM: CT HEAD WITHOUT CONTRAST CT MAXILLOFACIAL WITHOUT CONTRAST CT CERVICAL SPINE WITHOUT CONTRAST TECHNIQUE: Multidetector CT imaging of the head, cervical spine, and maxillofacial structures were performed using the standard protocol without intravenous contrast. Multiplanar CT image reconstructions of the cervical spine and maxillofacial structures were also generated. RADIATION DOSE REDUCTION: This exam was performed according to the departmental dose-optimization program which includes automated exposure control, adjustment of the mA and/or kV according to patient size and/or use of iterative reconstruction technique. COMPARISON:  CT maxillofacial dated July 31, 2018. FINDINGS: CT HEAD FINDINGS Brain: No evidence of acute infarction, hemorrhage, hydrocephalus, extra-axial collection or mass lesion/mass effect. Mild generalized cerebral atrophy. Vascular: No hyperdense vessel or unexpected calcification. Skull: Normal. Negative for fracture or focal lesion.  Other: None. CT MAXILLOFACIAL FINDINGS Osseous: No acute fracture or mandibular dislocation. No destructive process. Orbits: Negative. No traumatic or inflammatory finding. Sinuses: Small retention cysts in both maxillary sinuses. Otherwise clear. Soft tissues: Small right periorbital hematoma. CT CERVICAL SPINE FINDINGS Alignment: Straightening of the normal cervical lordosis. No traumatic malalignment. Skull base and vertebrae: No acute fracture. No primary bone lesion or focal pathologic process. Soft tissues and spinal canal: No prevertebral fluid or swelling. No visible canal hematoma. Disc levels: Mild-to-moderate multilevel disc height loss and facet uncovertebral hypertrophy throughout the cervical spine. Upper chest: Negative. Other: 9 mm hypodense nodule in the left thyroid lobe. Not clinically significant; no follow-up imaging recommended (ref: J Am Coll Radiol. 2015 Feb;12(2): 143-50). IMPRESSION: 1. No acute intracranial abnormality. Small right periorbital hematoma. 2. No acute facial fracture. 3. No acute cervical spine fracture or traumatic listhesis. Electronically Signed   By: Obie Dredge M.D.   On: 02/11/2022 11:03   DG Chest 2 View  Result Date: 02/11/2022 CLINICAL DATA:  Syncope, fall, weakness EXAM: CHEST - 2 VIEW COMPARISON:  None Available. FINDINGS: The heart size and mediastinal contours are within normal limits. Both lungs are clear. The visualized skeletal structures are unremarkable.  Trachea midline. No acute osseous finding. Skin fold noted over the left apex. Minor basilar atelectasis versus scarring. IMPRESSION: No active cardiopulmonary disease. Electronically Signed   By: Judie Petit.  Shick M.D.   On: 02/11/2022 10:56     Assessment & Plan:  Logan Garrett is a 51 y.o. male with what is more than likely an ileus due to the hyponatremia, hypokalemia but we need more information. The patient says he is hungry.   -CT a/p ordered to further assess and this will help [place] to determine  if there is any true concern for obstruction  -NPO   All questions were answered to the satisfaction of the patient.   Lucretia Roers 02/12/2022, 9:28 PM

## 2022-02-12 NOTE — Progress Notes (Signed)
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  Was brought to the emergency room after an unwitnessed fall.  Reported that he was dizzy.  Noted to have bruising around his right orbit.  Labs showed acute kidney injury, hyponatremia.  He was also hypotensive on arrival.  Admitted for IV fluids.  Had abdominal pain and vomiting.  Abdominal x-ray showing concern for possible SBO.  General surgery consulted.   Assessment & Plan:   Principal Problem:   Hyponatremia Active Problems:   AKI (acute kidney injury) (HCC)   Syncope   Dehydration   Hyperlipidemia   Periorbital hematoma   Fall at home, initial encounter   Syncope Fall Periorbital hematoma -Likely related to hypovolemia/hypotension -Continue IV hydration -Unclear whether patient truly had a seizure, sounds more like post syncopal convulsion -Overall sodium level has improved -EEG has been ordered -CT head without any intracranial abnormality -Continue to monitor for any recurrence   Hyponatremia -Related to hypovolemia, decrease p.o. intake -He has been started on saline with improvement of sodium -Monitor sodium every 4 hours -Urine sodium is 15 -Serum osmolarity 275  Hypokalemia -Replace -Magnesium normal  Hypotension -This is related to hypovolemia in the setting of renal failure and dehydration -Continue volume resuscitation   Acute kidney injury -Due to hypovolemia in the setting of ARB -Admission creatinine 2.54 -Current creatinine 1.5 -Continue IV fluids -Renal ultrasound negative for hydronephrosis  Possible small bowel obstruction -Noted to have abdominal pain and vomiting overnight -Abdominal x-ray showing concerns for SBO -Unable to pass NG tube -Continue n.p.o. -General surgery consulted    Hyperlipidemia -Resume statin when able to take p.o.   DVT prophylaxis: heparin injection 5,000 Units Start: 02/11/22 2200  Code Status: Full code Family Communication: Discussed with patient Disposition Plan: Status is: Inpatient Remains inpatient appropriate because: Continued management of hyponatremia, renal failure and bowel obstruction     Consultants:  Nephrology General surgery  Procedures:    Antimicrobials:      Subjective: Developed abdominal pain and vomiting overnight.  Reports that he has not had a bowel movement in a week.  Not really passing gas.  Says he has been having trouble with abdominal pain and vomiting for over a month now.  He had an episode overnight where he was on the commode and passed out.  There was concern that he may have had a seizure since he became very rigid.  Did not appear to have postictal period.  Objective: Vitals:   02/12/22 0600 02/12/22 0620 02/12/22 0700 02/12/22 0900  BP: (!) 72/40 (!) 85/57 (!) 80/53 (!) 83/48  Pulse: 93 93 94 91  Resp: 17 (!) 22    Temp:   (!) 97.3 F (36.3 C)   TempSrc:   Oral   SpO2: 95% 94% 96% 97%  Weight:      Height:        Intake/Output Summary (Last 24 hours) at 02/12/2022 1055 Last data filed at 02/12/2022 0000 Gross per 24 hour  Intake 515 ml  Output 900 ml  Net -385 ml   Filed Weights   02/11/22 0934 02/12/22 0500  Weight: 81.6 kg 79.8 kg    Examination:  General exam: Appears calm and comfortable  HEENT: bruising around right eye Respiratory system: Clear to auscultation. Respiratory effort normal. Cardiovascular system: S1 & S2  heard, RRR. No JVD, murmurs, rubs, gallops or clicks. No pedal edema. Gastrointestinal system: Abdomen is nondistended, soft and diffusely tender. No organomegaly or masses felt. Hypoactive bowel sounds heard. Central nervous system: Alert and oriented. No focal neurological deficits. Extremities: Symmetric 5 x 5 power. Skin: No rashes, lesions or  ulcers Psychiatry: Judgement and insight appear normal. Mood & affect appropriate.     Data Reviewed: I have personally reviewed following labs and imaging studies  CBC: Recent Labs  Lab 02/11/22 0952 02/12/22 0358  WBC 13.3* 15.1*  NEUTROABS 9.1*  --   HGB 18.2* 17.0  HCT 50.4 45.8  MCV 86.7 87.6  PLT 329 237   Basic Metabolic Panel: Recent Labs  Lab 02/11/22 0952 02/11/22 1218 02/11/22 1851 02/11/22 2313 02/12/22 0326 02/12/22 0358 02/12/22 0939  NA 115* 117* 115* 121* 122* 121* 122*  K 2.8* 3.0*  --   --   --  2.9*  --   CL 70* 77*  --   --   --  84*  --   CO2 26 25  --   --   --  25  --   GLUCOSE 127* 104*  --   --   --  101*  --   BUN 92* 87*  --   --   --  57*  --   CREATININE 2.54* 2.07*  --   --   --  1.51*  --   CALCIUM 9.0 8.1*  --   --   --  8.3*  --   MG 2.8*  --   --   --   --  2.4  --    GFR: Estimated Creatinine Clearance: 64.6 mL/min (A) (by C-G formula based on SCr of 1.51 mg/dL (H)). Liver Function Tests: Recent Labs  Lab 02/12/22 0358  AST 19  ALT 16  ALKPHOS 52  BILITOT 1.8*  PROT 6.1*  ALBUMIN 3.3*   No results for input(s): "LIPASE", "AMYLASE" in the last 168 hours. No results for input(s): "AMMONIA" in the last 168 hours. Coagulation Profile: No results for input(s): "INR", "PROTIME" in the last 168 hours. Cardiac Enzymes: Recent Labs  Lab 02/11/22 0952  CKTOTAL 484*   BNP (last 3 results) No results for input(s): "PROBNP" in the last 8760 hours. HbA1C: No results for input(s): "HGBA1C" in the last 72 hours. CBG: No results for input(s): "GLUCAP" in the last 168 hours. Lipid Profile: No results for input(s): "CHOL", "HDL", "LDLCALC", "TRIG", "CHOLHDL", "LDLDIRECT" in the last 72 hours. Thyroid Function Tests: Recent Labs    02/11/22 1117  TSH 0.729   Anemia Panel: No results for input(s): "VITAMINB12", "FOLATE", "FERRITIN", "TIBC", "IRON", "RETICCTPCT" in the last 72 hours. Sepsis Labs: Recent Labs  Lab  02/11/22 5945 02/11/22 1117  LATICACIDVEN 2.2* 1.5    Recent Results (from the past 240 hour(s))  Resp Panel by RT-PCR (Flu A&B, Covid) Anterior Nasal Swab     Status: None   Collection Time: 02/11/22  9:52 AM   Specimen: Anterior Nasal Swab  Result Value Ref Range Status   SARS Coronavirus 2 by RT PCR NEGATIVE NEGATIVE Final    Comment: (NOTE) SARS-CoV-2 target nucleic acids are NOT DETECTED.  The SARS-CoV-2 RNA is generally detectable in upper respiratory specimens during the acute phase of infection. The lowest concentration of SARS-CoV-2 viral copies this assay can detect is 138 copies/mL. A negative result does not preclude SARS-Cov-2 infection and should not be used as the sole basis for treatment or  other patient management decisions. A negative result may occur with  improper specimen collection/handling, submission of specimen other than nasopharyngeal swab, presence of viral mutation(s) within the areas targeted by this assay, and inadequate number of viral copies(<138 copies/mL). A negative result must be combined with clinical observations, patient history, and epidemiological information. The expected result is Negative.  Fact Sheet for Patients:  BloggerCourse.comhttps://www.fda.gov/media/152166/download  Fact Sheet for Healthcare Providers:  SeriousBroker.ithttps://www.fda.gov/media/152162/download  This test is no t yet approved or cleared by the [place] FDA and  has been authorized for detection and/or diagnosis of SARS-CoV-2 by FDA under an Emergency Use Authorization (EUA). This EUA will remain  in effect (meaning this test can be used) for the duration of the COVID-19 declaration under Section 564(b)(1) of the Act, 21 U.S.C.section 360bbb-3(b)(1), unless the authorization is terminated  or revoked sooner.       Influenza A by PCR NEGATIVE NEGATIVE Final   Influenza B by PCR NEGATIVE NEGATIVE Final    Comment: (NOTE) The Xpert Xpress SARS-CoV-2/FLU/RSV plus assay is intended as  an aid in the diagnosis of influenza from Nasopharyngeal swab specimens and should not be used as a sole basis for treatment. Nasal washings and aspirates are unacceptable for Xpert Xpress SARS-CoV-2/FLU/RSV testing.  Fact Sheet for Patients: BloggerCourse.comhttps://www.fda.gov/media/152166/download  Fact Sheet for Healthcare Providers: SeriousBroker.ithttps://www.fda.gov/media/152162/download  This test is not yet approved or cleared by the [place] FDA and has been authorized for detection and/or diagnosis of SARS-CoV-2 by FDA under an Emergency Use Authorization (EUA). This EUA will remain in effect (meaning this test can be used) for the duration of the COVID-19 declaration under Section 564(b)(1) of the Act, 21 U.S.C. section 360bbb-3(b)(1), unless the authorization is terminated or revoked.  Performed at [facility], [address] [place], [place] [address]   MRSA Next Gen by PCR, Nasal     Status: None   Collection Time: 02/11/22  6:30 PM   Specimen: Nasal Mucosa; Nasal Swab  Result Value Ref Range Status   MRSA by PCR Next Gen NOT DETECTED NOT DETECTED Final    Comment: (NOTE) The GeneXpert MRSA Assay (FDA approved for NASAL specimens only), is one component of a comprehensive MRSA colonization surveillance program. It is not intended to diagnose MRSA infection nor to guide or monitor treatment for MRSA infections. Test performance is not FDA approved in patients less than 53 years old. Performed at [facility], [address] [place], [place] [address]          Radiology Studies: DG Abd 1 View  Result Date: 02/11/2022 CLINICAL DATA:  Abdominal pain. EXAM: ABDOMEN - 1 VIEW COMPARISON:  None Available. FINDINGS: There are dilated air-filled small bowel loops in the upper abdomen measuring up to 5 cm. The stomach is also dilated and air-filled. Minimal colonic gas identified. There is a phlebolith in the left hemipelvis. No acute fractures are seen. There are degenerative changes of the  lower lumbar spine. Visualized lung bases are clear. IMPRESSION: 1. Dilated proximal small bowel and stomach worrisome for small bowel obstruction. Electronically Signed   By: Darliss CheneyAmy  Guttmann M.D.   On: 02/11/2022 23:21   US RENAL  Result Date: 02/11/2022 CLINICAL DATA:  Renal dysfunction EXAM: RENAL / URINARY TRACT ULTRASOUND COMPLETE COMPARISON:  CT abdomen done on 04/13/2012 FINDINGS: Right Kidney: Renal measurements: 11.9 x 7.5 x 6.8 cm = volume: 315.1 mL. There is no hydronephrosis. There is increased cortical echogenicity. Left Kidney: Renal measurements: 12.5 x 6.2 by 6.1 cm = volume:  249.1 mL. There is no hydronephrosis. There is increased cortical echogenicity. Technologist observed 6 mm hyperechoic focus in the upper pole of left kidney. This may suggest nonobstructing calculus or partial volume averaging of renal sinus fat. Bladder: Unremarkable. Other: None. IMPRESSION: There is no hydronephrosis. There is increased cortical echogenicity in the kidneys suggesting possible medical renal disease. 6 mm hyperechoic focus in the left kidney may suggest nonobstructing calculus or partial volume averaging artifact. Electronically Signed   By: Ernie Avena M.D.   On: 02/11/2022 13:53   CT Head Wo Contrast  Result Date: 02/11/2022 CLINICAL DATA:  Confusion and possible seizure after unwitnessed fall last night. Bruising to the right eye. EXAM: CT HEAD WITHOUT CONTRAST CT MAXILLOFACIAL WITHOUT CONTRAST CT CERVICAL SPINE WITHOUT CONTRAST TECHNIQUE: Multidetector CT imaging of the head, cervical spine, and maxillofacial structures were performed using the standard protocol without intravenous contrast. Multiplanar CT image reconstructions of the cervical spine and maxillofacial structures were also generated. RADIATION DOSE REDUCTION: This exam was performed according to the departmental dose-optimization program which includes automated exposure control, adjustment of the mA and/or kV according to  patient size and/or use of iterative reconstruction technique. COMPARISON:  CT maxillofacial dated July 31, 2018. FINDINGS: CT HEAD FINDINGS Brain: No evidence of acute infarction, hemorrhage, hydrocephalus, extra-axial collection or mass lesion/mass effect. Mild generalized cerebral atrophy. Vascular: No hyperdense vessel or unexpected calcification. Skull: Normal. Negative for fracture or focal lesion. Other: None. CT MAXILLOFACIAL FINDINGS Osseous: No acute fracture or mandibular dislocation. No destructive process. Orbits: Negative. No traumatic or inflammatory finding. Sinuses: Small retention cysts in both maxillary sinuses. Otherwise clear. Soft tissues: Small right periorbital hematoma. CT CERVICAL SPINE FINDINGS Alignment: Straightening of the normal cervical lordosis. No traumatic malalignment. Skull base and vertebrae: No acute fracture. No primary bone lesion or focal pathologic process. Soft tissues and spinal canal: No prevertebral fluid or swelling. No visible canal hematoma. Disc levels: Mild-to-moderate multilevel disc height loss and facet uncovertebral hypertrophy throughout the cervical spine. Upper chest: Negative. Other: 9 mm hypodense nodule in the left thyroid lobe. Not clinically significant; no follow-up imaging recommended (ref: J Am Coll Radiol. 2015 Feb;12(2): 143-50). IMPRESSION: 1. No acute intracranial abnormality. Small right periorbital hematoma. 2. No acute facial fracture. 3. No acute cervical spine fracture or traumatic listhesis. Electronically Signed   By: Obie Dredge M.D.   On: 02/11/2022 11:03   CT Maxillofacial Wo Contrast  Result Date: 02/11/2022 CLINICAL DATA:  Confusion and possible seizure after unwitnessed fall last night. Bruising to the right eye. EXAM: CT HEAD WITHOUT CONTRAST CT MAXILLOFACIAL WITHOUT CONTRAST CT CERVICAL SPINE WITHOUT CONTRAST TECHNIQUE: Multidetector CT imaging of the head, cervical spine, and maxillofacial structures were performed  using the standard protocol without intravenous contrast. Multiplanar CT image reconstructions of the cervical spine and maxillofacial structures were also generated. RADIATION DOSE REDUCTION: This exam was performed according to the departmental dose-optimization program which includes automated exposure control, adjustment of the mA and/or kV according to patient size and/or use of iterative reconstruction technique. COMPARISON:  CT maxillofacial dated July 31, 2018. FINDINGS: CT HEAD FINDINGS Brain: No evidence of acute infarction, hemorrhage, hydrocephalus, extra-axial collection or mass lesion/mass effect. Mild generalized cerebral atrophy. Vascular: No hyperdense vessel or unexpected calcification. Skull: Normal. Negative for fracture or focal lesion. Other: None. CT MAXILLOFACIAL FINDINGS Osseous: No acute fracture or mandibular dislocation. No destructive process. Orbits: Negative. No traumatic or inflammatory finding. Sinuses: Small retention cysts in both maxillary sinuses. Otherwise clear. Soft tissues: Small right  periorbital hematoma. CT CERVICAL SPINE FINDINGS Alignment: Straightening of the normal cervical lordosis. No traumatic malalignment. Skull base and vertebrae: No acute fracture. No primary bone lesion or focal pathologic process. Soft tissues and spinal canal: No prevertebral fluid or swelling. No visible canal hematoma. Disc levels: Mild-to-moderate multilevel disc height loss and facet uncovertebral hypertrophy throughout the cervical spine. Upper chest: Negative. Other: 9 mm hypodense nodule in the left thyroid lobe. Not clinically significant; no follow-up imaging recommended (ref: J Am Coll Radiol. 2015 Feb;12(2): 143-50). IMPRESSION: 1. No acute intracranial abnormality. Small right periorbital hematoma. 2. No acute facial fracture. 3. No acute cervical spine fracture or traumatic listhesis. Electronically Signed   By: Obie Dredge M.D.   On: 02/11/2022 11:03   CT Cervical Spine  Wo Contrast  Result Date: 02/11/2022 CLINICAL DATA:  Confusion and possible seizure after unwitnessed fall last night. Bruising to the right eye. EXAM: CT HEAD WITHOUT CONTRAST CT MAXILLOFACIAL WITHOUT CONTRAST CT CERVICAL SPINE WITHOUT CONTRAST TECHNIQUE: Multidetector CT imaging of the head, cervical spine, and maxillofacial structures were performed using the standard protocol without intravenous contrast. Multiplanar CT image reconstructions of the cervical spine and maxillofacial structures were also generated. RADIATION DOSE REDUCTION: This exam was performed according to the departmental dose-optimization program which includes automated exposure control, adjustment of the mA and/or kV according to patient size and/or use of iterative reconstruction technique. COMPARISON:  CT maxillofacial dated July 31, 2018. FINDINGS: CT HEAD FINDINGS Brain: No evidence of acute infarction, hemorrhage, hydrocephalus, extra-axial collection or mass lesion/mass effect. Mild generalized cerebral atrophy. Vascular: No hyperdense vessel or unexpected calcification. Skull: Normal. Negative for fracture or focal lesion. Other: None. CT MAXILLOFACIAL FINDINGS Osseous: No acute fracture or mandibular dislocation. No destructive process. Orbits: Negative. No traumatic or inflammatory finding. Sinuses: Small retention cysts in both maxillary sinuses. Otherwise clear. Soft tissues: Small right periorbital hematoma. CT CERVICAL SPINE FINDINGS Alignment: Straightening of the normal cervical lordosis. No traumatic malalignment. Skull base and vertebrae: No acute fracture. No primary bone lesion or focal pathologic process. Soft tissues and spinal canal: No prevertebral fluid or swelling. No visible canal hematoma. Disc levels: Mild-to-moderate multilevel disc height loss and facet uncovertebral hypertrophy throughout the cervical spine. Upper chest: Negative. Other: 9 mm hypodense nodule in the left thyroid lobe. Not clinically  significant; no follow-up imaging recommended (ref: J Am Coll Radiol. 2015 Feb;12(2): 143-50). IMPRESSION: 1. No acute intracranial abnormality. Small right periorbital hematoma. 2. No acute facial fracture. 3. No acute cervical spine fracture or traumatic listhesis. Electronically Signed   By: Obie Dredge M.D.   On: 02/11/2022 11:03   DG Chest 2 View  Result Date: 02/11/2022 CLINICAL DATA:  Syncope, fall, weakness EXAM: CHEST - 2 VIEW COMPARISON:  None Available. FINDINGS: The heart size and mediastinal contours are within normal limits. Both lungs are clear. The visualized skeletal structures are unremarkable. Trachea midline. No acute osseous finding. Skin fold noted over the left apex. Minor basilar atelectasis versus scarring. IMPRESSION: No active cardiopulmonary disease. Electronically Signed   By: Judie Petit.  Shick M.D.   On: 02/11/2022 10:56        Scheduled Meds:  Chlorhexidine Gluconate Cloth  6 each Topical Daily   heparin  5,000 Units Subcutaneous Q8H   Continuous Infusions:  sodium chloride 75 mL/hr at 02/12/22 0956   potassium chloride 10 mEq (02/12/22 1055)     LOS: 1 day    Time spent:    Erick Blinks, MD [facility]   If 7PM-7AM,  please contact night-coverage [email or web address]  02/12/2022, 10:55 AM

## 2022-02-12 NOTE — Progress Notes (Addendum)
02/11/2022 2205:  Pt had large emesis at this time of 500 ml of bile colored fluid.  Zofran given per orders.  02/11/2022 2210:  Dr. Lazarus Salines notifed at this time.  She was notified of large emesis and pt having minimal to no bowel sounds.  Abdominal xray ordered.  02/11/2022 2305:  Xray done. 02/12/2022 0005:  Dr. Lazarus Salines notified of results of xray.  She ordered to insert NG tube. 0010:  Attempted to place NG tube with assistance of another RN with no success.  Pt has had broken nose in the past and has definite curve to nose.   0015:  Dr. Ree Kida of inability to insert tube.   69:  Assisted pt to the toilet at this time.  Pt has been unable to void and was hoping that he would be able to void if he got on the toilet.  Assisted pt to toilet with one assist and stayed by pt side the entire time.   Prison guard at the side of pt as well.  Pt began to pass out and fall forward.  I sat pt up to maintain his airway and called for help.   0051:  Pt began to have seizure at this time.  He became very rigid and clamped mouth closed.  Skin color became grayish-blue.  Two other nurses at this bedside.  Pt was lowered to the floor to maintain safety.   00 55:  Pt awake and answering questions at this time.  Assisted to standing up and placing back in bed with the assistance of two nurses.  0100:  Dr. Lazarus Salines notified of happenings, will see pt shortly. 0145:  Dr here to see pt.  She ordered an EEG which will be done in the am and also ordered seizure precautions and ativan if needed.  Will maintain pt safety.  Side rails padded.  Suction ready at bedside if needed.   0630:  No signs of seizure activity the rest of the shift.  Pt resting comfortably.

## 2022-02-12 NOTE — Progress Notes (Signed)
EEG complete - results pending 

## 2022-02-12 NOTE — Procedures (Signed)
Patient Name: Logan Garrett  MRN: [id number]  Epilepsy Attending: Charlsie Quest  Referring Physician/Provider: Lilyan Gilford, DO  Date: 02/12/2022 Duration: 22.35 mins  Patient history: 51yo M undergoing eeg to evaluate for seizure  Level of alertness: Awake, asleep  AEDs during EEG study: None  Technical aspects: This EEG study was done with scalp electrodes positioned according to the 10-20 International system of electrode placement. Electrical activity was acquired at a sampling rate of 500Hz  and reviewed with a high frequency filter of 70Hz  and a low frequency filter of 1Hz . EEG data were recorded continuously and digitally stored.   Description: The posterior dominant rhythm consists of 8-9 Hz activity of moderate voltage (25-35 uV) seen predominantly in posterior head regions, symmetric and reactive to eye opening and eye closing. Sleep was characterized by vertex waves, sleep spindles (12 to 14 Hz), maximal frontocentral region. Hyperventilation and photic stimulation were not performed.     IMPRESSION: This study is within normal limits. No seizures or epileptiform discharges were seen throughout the recording.  Adelynne Joerger 

## 2022-02-12 NOTE — Progress Notes (Signed)
Admit: 02/11/2022 LOS: 1  55M with severe hyponatremia, AKI (presumed normal GFR at baseline)  Subjective:  Some N/V overnight SCr improving nicely with IVFs  SNa 115 --> 122 Remains on 0.9% NS @ 15mL/h; NPO Only 0.4L UOP ACEi held   06/08 0701 - 06/09 0700 In: 515 [P.O.:100; I.V.:415] Out: 900 [Urine:400; Emesis/NG output:500]  Filed Weights   02/11/22 0934 02/12/22 0500  Weight: 81.6 kg 79.8 kg    Scheduled Meds:  Chlorhexidine Gluconate Cloth  6 each Topical Daily   heparin  5,000 Units Subcutaneous Q8H   Continuous Infusions:  sodium chloride 75 mL/hr at 02/12/22 1243   potassium chloride 10 mEq (02/12/22 1225)   PRN Meds:.acetaminophen **OR** acetaminophen, LORazepam, ondansetron **OR** ondansetron (ZOFRAN) IV  Current Labs: reviewed   Latest Reference Range & Units 02/11/22 13:11  Osmolality, Urine 300 - 900 mOsm/kg 510  Sodium, Urine mmol/L 15    Latest Reference Range & Units 02/11/22 11:17  Osmolality 275 - 295 mOsm/kg 275   Physical Exam:  Blood pressure (!) 85/52, pulse 81, temperature 98 F (36.7 C), temperature source Oral, resp. rate 20, height 6\' 4"  (1.93 m), weight 79.8 kg, SpO2 97 %. NAD, lying in bed RRR CTAB No LEE S/nt/nd Nonfocal, AAO x3 NCAT EOMI  A AKI, normal baseline Probably hypovolemia + ACEi = ATN Improving with IVFs, holding ACEi Renal 02/11/22 w/o HN or acute structural issues, some inc echogenicity UA at admit w/o hematuria/protienuria Severe ?symptomatic hypovolemic hyponatremia Logan Garrett 2/2 pre-renal state Has improved at desired rate with IVFs Would cont NS at this time, goal is SNa around 130 in 24h.  HTN: BPs are soft, off ACEi,  Hypokalemia, repleted  P As above Daily weights, Daily Renal Panel, Strict I/Os, Avoid nephrotoxins (NSAIDs, judicious IV Contrast)   Over the weekend we will follow labs, vitals, urine output.  Please call for any questions or concerns.  04/13/22 MD 02/12/2022, 12:45 PM  Recent Labs   Lab 02/11/22 04/13/22 02/11/22 1218 02/11/22 1851 02/12/22 0326 02/12/22 0358 02/12/22 0939  NA 115* 117*   < > 122* 121* 122*  K 2.8* 3.0*  --   --  2.9*  --   CL 70* 77*  --   --  84*  --   CO2 26 25  --   --  25  --   GLUCOSE 127* 104*  --   --  101*  --   BUN 92* 87*  --   --  57*  --   CREATININE 2.54* 2.07*  --   --  1.51*  --   CALCIUM 9.0 8.1*  --   --  8.3*  --    < > = values in this interval not displayed.   Recent Labs  Lab 02/11/22 0952 02/12/22 0358  WBC 13.3* 15.1*  NEUTROABS 9.1*  --   HGB 18.2* 17.0  HCT 50.4 45.8  MCV 86.7 87.6  PLT 329 237

## 2022-02-13 ENCOUNTER — Inpatient Hospital Stay (HOSPITAL_COMMUNITY): Admitting: Anesthesiology

## 2022-02-13 ENCOUNTER — Encounter (HOSPITAL_COMMUNITY): Payer: Self-pay | Admitting: Internal Medicine

## 2022-02-13 ENCOUNTER — Encounter (HOSPITAL_COMMUNITY): Admission: EM | Payer: Self-pay | Attending: Internal Medicine

## 2022-02-13 DIAGNOSIS — E860 Dehydration: Secondary | ICD-10-CM | POA: Diagnosis not present

## 2022-02-13 DIAGNOSIS — K420 Umbilical hernia with obstruction, without gangrene: Secondary | ICD-10-CM | POA: Diagnosis not present

## 2022-02-13 DIAGNOSIS — K56609 Unspecified intestinal obstruction, unspecified as to partial versus complete obstruction: Secondary | ICD-10-CM | POA: Diagnosis not present

## 2022-02-13 DIAGNOSIS — E871 Hypo-osmolality and hyponatremia: Secondary | ICD-10-CM | POA: Diagnosis not present

## 2022-02-13 DIAGNOSIS — E782 Mixed hyperlipidemia: Secondary | ICD-10-CM | POA: Diagnosis not present

## 2022-02-13 DIAGNOSIS — N179 Acute kidney failure, unspecified: Secondary | ICD-10-CM | POA: Diagnosis not present

## 2022-02-13 HISTORY — PX: UMBILICAL HERNIA REPAIR: SHX196

## 2022-02-13 LAB — BASIC METABOLIC PANEL
Anion gap: 6 (ref 5–15)
BUN: 33 mg/dL — ABNORMAL HIGH (ref 6–20)
CO2: 28 mmol/L (ref 22–32)
Calcium: 8 mg/dL — ABNORMAL LOW (ref 8.9–10.3)
Chloride: 90 mmol/L — ABNORMAL LOW (ref 98–111)
Creatinine, Ser: 1.13 mg/dL (ref 0.61–1.24)
GFR, Estimated: >60 mL/min (ref >60)
Glucose, Bld: 128 mg/dL — ABNORMAL HIGH (ref 70–99)
Potassium: 3.2 mmol/L — ABNORMAL LOW (ref 3.5–5.1)
Sodium: 124 mmol/L — ABNORMAL LOW (ref 135–145)

## 2022-02-13 LAB — CBC
HCT: 41.8 % (ref 39.0–52.0)
Hemoglobin: 15.3 g/dL (ref 13.0–17.0)
MCH: 32.6 pg (ref 26.0–34.0)
MCHC: 36.6 g/dL — ABNORMAL HIGH (ref 30.0–36.0)
MCV: 88.9 fL (ref 80.0–100.0)
Platelets: 264 10*3/uL (ref 150–400)
RBC: 4.7 MIL/uL (ref 4.22–5.81)
RDW: 11.3 % — ABNORMAL LOW (ref 11.5–15.5)
WBC: 19.3 10*3/uL — ABNORMAL HIGH (ref 4.0–10.5)
nRBC: 0 % (ref 0.0–0.2)

## 2022-02-13 LAB — SODIUM
Sodium: 123 mmol/L — ABNORMAL LOW (ref 135–145)
Sodium: 124 mmol/L — ABNORMAL LOW (ref 135–145)
Sodium: 125 mmol/L — ABNORMAL LOW (ref 135–145)
Sodium: 126 mmol/L — ABNORMAL LOW (ref 135–145)
Sodium: 128 mmol/L — ABNORMAL LOW (ref 135–145)

## 2022-02-13 SURGERY — REPAIR, HERNIA, UMBILICAL, ADULT
Anesthesia: General

## 2022-02-13 MED ORDER — POTASSIUM CHLORIDE 10 MEQ/100ML IV SOLN
10.0000 meq | INTRAVENOUS | Status: AC
  Administered 2022-02-13 (×4): 10 meq via INTRAVENOUS
  Filled 2022-02-13 (×4): qty 100

## 2022-02-13 MED ORDER — ROCURONIUM BROMIDE 100 MG/10ML IV SOLN
INTRAVENOUS | Status: DC | PRN
  Administered 2022-02-13: 50 mg via INTRAVENOUS

## 2022-02-13 MED ORDER — PIPERACILLIN-TAZOBACTAM 3.375 G IVPB
3.3750 g | Freq: Three times a day (TID) | INTRAVENOUS | Status: DC
  Administered 2022-02-13 – 2022-02-14 (×4): 3.375 g via INTRAVENOUS
  Filled 2022-02-13 (×4): qty 50

## 2022-02-13 MED ORDER — POTASSIUM CHLORIDE CRYS ER 20 MEQ PO TBCR: 20.0000 meq | EXTENDED_RELEASE_TABLET | Freq: Two times a day (BID) | ORAL | Status: DC

## 2022-02-13 MED ORDER — SUCCINYLCHOLINE CHLORIDE 200 MG/10ML IV SOSY
PREFILLED_SYRINGE | INTRAVENOUS | Status: DC | PRN
  Administered 2022-02-13: 100 mg via INTRAVENOUS

## 2022-02-13 MED ORDER — MIDAZOLAM HCL 5 MG/5ML IJ SOLN
INTRAMUSCULAR | Status: DC | PRN
  Administered 2022-02-13: 2 mg via INTRAVENOUS

## 2022-02-13 MED ORDER — SODIUM CHLORIDE 0.9 % IR SOLN
Status: DC | PRN
  Administered 2022-02-13: 1000 mL

## 2022-02-13 MED ORDER — PROCHLORPERAZINE EDISYLATE 10 MG/2ML IJ SOLN
10.0000 mg | Freq: Four times a day (QID) | INTRAMUSCULAR | Status: DC | PRN
  Administered 2022-02-13: 10 mg via INTRAVENOUS
  Filled 2022-02-13: qty 2

## 2022-02-13 MED ORDER — MORPHINE SULFATE (PF) 2 MG/ML IV SOLN: 2.0000 mg | INTRAVENOUS | Status: DC | PRN

## 2022-02-13 MED ORDER — FENTANYL CITRATE PF 50 MCG/ML IJ SOSY
PREFILLED_SYRINGE | INTRAMUSCULAR | Status: AC
  Filled 2022-02-13: qty 1

## 2022-02-13 MED ORDER — PIPERACILLIN-TAZOBACTAM 3.375 G IVPB 30 MIN
3.3750 g | Freq: Once | INTRAVENOUS | Status: AC
  Administered 2022-02-13: 3.375 g via INTRAVENOUS
  Filled 2022-02-13 (×2): qty 50

## 2022-02-13 MED ORDER — CHLORHEXIDINE GLUCONATE CLOTH 2 % EX PADS: 6.0000 | MEDICATED_PAD | Freq: Once | CUTANEOUS | Status: DC

## 2022-02-13 MED ORDER — PROPOFOL 10 MG/ML IV BOLUS
INTRAVENOUS | Status: AC
  Filled 2022-02-13: qty 20

## 2022-02-13 MED ORDER — SUGAMMADEX SODIUM 200 MG/2ML IV SOLN
INTRAVENOUS | Status: DC | PRN
  Administered 2022-02-13: 200 mg via INTRAVENOUS

## 2022-02-13 MED ORDER — MIDAZOLAM HCL 2 MG/2ML IJ SOLN
INTRAMUSCULAR | Status: AC
  Filled 2022-02-13: qty 2

## 2022-02-13 MED ORDER — MELATONIN 3 MG PO TABS
6.0000 mg | ORAL_TABLET | Freq: Once | ORAL | Status: AC
  Administered 2022-02-13: 6 mg via ORAL
  Filled 2022-02-13: qty 2

## 2022-02-13 MED ORDER — BUPIVACAINE HCL (PF) 0.5 % IJ SOLN
INTRAMUSCULAR | Status: AC
  Filled 2022-02-13: qty 30

## 2022-02-13 MED ORDER — PROPOFOL 10 MG/ML IV BOLUS
INTRAVENOUS | Status: DC | PRN
  Administered 2022-02-13: 100 mg via INTRAVENOUS

## 2022-02-13 MED ORDER — FENTANYL CITRATE (PF) 100 MCG/2ML IJ SOLN
INTRAMUSCULAR | Status: AC
  Filled 2022-02-13: qty 2

## 2022-02-13 MED ORDER — SUCCINYLCHOLINE CHLORIDE 200 MG/10ML IV SOSY
PREFILLED_SYRINGE | INTRAVENOUS | Status: AC
  Filled 2022-02-13: qty 10

## 2022-02-13 MED ORDER — FENTANYL CITRATE (PF) 100 MCG/2ML IJ SOLN
INTRAMUSCULAR | Status: DC | PRN
  Administered 2022-02-13: 100 ug via INTRAVENOUS

## 2022-02-13 MED ORDER — BUPIVACAINE LIPOSOME 1.3 % IJ SUSP
INTRAMUSCULAR | Status: DC | PRN
  Administered 2022-02-13: 20 mL

## 2022-02-13 MED ORDER — FENTANYL CITRATE PF 50 MCG/ML IJ SOSY
50.0000 ug | PREFILLED_SYRINGE | Freq: Once | INTRAMUSCULAR | Status: AC
  Administered 2022-02-13: 50 ug via INTRAVENOUS

## 2022-02-13 SURGICAL SUPPLY — 31 items
BLADE SURG 15 STRL LF DISP TIS (BLADE) ×1 IMPLANT
BLADE SURG 15 STRL SS (BLADE) ×1
CHLORAPREP W/TINT 26 (MISCELLANEOUS) ×2 IMPLANT
CLOTH BEACON ORANGE TIMEOUT ST (SAFETY) ×2 IMPLANT
COVER LIGHT HANDLE STERIS (MISCELLANEOUS) ×4 IMPLANT
DERMABOND ADVANCED (GAUZE/BANDAGES/DRESSINGS) ×1
DERMABOND ADVANCED .7 DNX12 (GAUZE/BANDAGES/DRESSINGS) ×1 IMPLANT
ELECT REM PT RETURN 9FT ADLT (ELECTROSURGICAL) ×2
ELECTRODE REM PT RTRN 9FT ADLT (ELECTROSURGICAL) ×1 IMPLANT
GAUZE 4X4 16PLY ~~LOC~~+RFID DBL (SPONGE) ×2 IMPLANT
GLOVE BIO SURGEON STRL SZ 6.5 (GLOVE) ×3 IMPLANT
GLOVE BIOGEL PI IND STRL 6.5 (GLOVE) ×1 IMPLANT
GLOVE BIOGEL PI IND STRL 7.0 (GLOVE) ×2 IMPLANT
GLOVE BIOGEL PI INDICATOR 6.5 (GLOVE) ×2
GLOVE BIOGEL PI INDICATOR 7.0 (GLOVE) ×2
GOWN STRL REUS W/TWL LRG LVL3 (GOWN DISPOSABLE) ×4 IMPLANT
INST SET MINOR GENERAL (KITS) ×2 IMPLANT
KIT TURNOVER KIT A (KITS) ×2 IMPLANT
MANIFOLD NEPTUNE II (INSTRUMENTS) ×2 IMPLANT
NDL HYPO 21X1.5 SAFETY (NEEDLE) ×1 IMPLANT
NEEDLE HYPO 21X1.5 SAFETY (NEEDLE) ×2 IMPLANT
NS IRRIG 1000ML POUR BTL (IV SOLUTION) ×2 IMPLANT
PACK MINOR (CUSTOM PROCEDURE TRAY) ×2 IMPLANT
PAD ARMBOARD 7.5X6 YLW CONV (MISCELLANEOUS) ×2 IMPLANT
PENCIL SMOKE EVACUATOR (MISCELLANEOUS) ×2 IMPLANT
SET BASIN LINEN APH (SET/KITS/TRAYS/PACK) ×2 IMPLANT
SUT ETHIBOND NAB MO 7 #0 18IN (SUTURE) ×2 IMPLANT
SUT MNCRL AB 4-0 PS2 18 (SUTURE) ×2 IMPLANT
SUT VIC AB 3-0 SH 27 (SUTURE) ×1
SUT VIC AB 3-0 SH 27X BRD (SUTURE) ×1 IMPLANT
SYR 20ML LL LF (SYRINGE) ×4 IMPLANT

## 2022-02-13 NOTE — Progress Notes (Signed)
[facility]  Updated team. NPO and sips/ice today. Await bowel function. Umbilical hernia repair, bowel reduced. Binder ordered. PRN for pain.   Curlene Labrum, MD [facility] [address] [place], [place] [address] [phone] (office)

## 2022-02-13 NOTE — Progress Notes (Signed)
Pharmacy Antibiotic Note  Logan Garrett is a 52 y.o. male admitted on 02/11/2022 with  concern for intra-abdominal infection .  Pharmacy has been consulted for Zosyn dosing.  Plan: Zosyn 3.375g IV q8h (4 hour infusion).  Height: 6\' 4"  (193 cm) Weight: 79.8 kg (175 lb 14.8 oz) IBW/kg (Calculated) : 86.8  Temp (24hrs), Avg:98.6 F (37 C), Min:98 F (36.7 C), Max:99.2 F (37.3 C)  Recent Labs  Lab 02/11/22 0952 02/11/22 1117 02/11/22 1218 02/12/22 0358 02/13/22 0358  WBC 13.3*  --   --  15.1* 19.3*  CREATININE 2.54*  --  2.07* 1.51* 1.13  LATICACIDVEN 2.2* 1.5  --   --   --     Estimated Creatinine Clearance: 86.3 mL/min (by C-G formula based on SCr of 1.13 mg/dL).    No Known Allergies   Thank you for allowing pharmacy to be a part of this patient's care.  Wynona Neat, PharmD, BCPS  02/13/2022 7:28 AM

## 2022-02-13 NOTE — Progress Notes (Signed)
Patient ID: Logan Garrett, male   DOB: 30-Jun-1971, 51 y.o.   MRN: [id number] Chart reviewed remotely. Hyponatremia persists with very low rate of correction-we will increase normal saline to 125 cc/h and replace hypokalemia which will improve overall serum osmolality and shield from effects of hypotonicity. Please call with questions or concerns.  Elmarie Shiley MD [facility]. Office # [phone] Pager # [phone] 10:57 AM

## 2022-02-13 NOTE — Anesthesia Procedure Notes (Signed)
Procedure Name: Intubation Date/Time: 02/13/2022 9:33 AM  Performed by: Windell Norfolk, MDPre-anesthesia Checklist: Patient identified, Emergency Drugs available, Suction available, Patient being monitored and Timeout performed Patient Re-evaluated:Patient Re-evaluated prior to induction Oxygen Delivery Method: Circle system utilized Preoxygenation: Pre-oxygenation with 100% oxygen Induction Type: IV induction Laryngoscope Size: Glidescope and 3 Grade View: Grade I Tube type: Oral Tube size: 7.0 mm Number of attempts: 1 Airway Equipment and Method: Video-laryngoscopy and Stylet Placement Confirmation: ETT inserted through vocal cords under direct vision, positive ETCO2 and breath sounds checked- equal and bilateral Secured at: 22 cm Tube secured with: Tape Dental Injury: Teeth and Oropharynx as per pre-operative assessment

## 2022-02-13 NOTE — Anesthesia Postprocedure Evaluation (Signed)
Anesthesia Post Note  Patient: Ladd Cen  Procedure(s) Performed: HERNIA REPAIR UMBILICAL ADULT  Patient location during evaluation: ICU Anesthesia Type: General Level of consciousness: awake and alert Pain management: pain level controlled Vital Signs Assessment: post-procedure vital signs reviewed and stable Respiratory status: spontaneous breathing, nonlabored ventilation, respiratory function stable and patient connected to nasal cannula oxygen Cardiovascular status: blood pressure returned to baseline and stable Postop Assessment: no apparent nausea or vomiting Anesthetic complications: no   No notable events documented.   Last Vitals:  Vitals:   02/13/22 0800 02/13/22 1045  BP: 97/62 (!) 81/48  Pulse: 79 (!) 104  Resp: 16   Temp:    SpO2: 96% 92%    Last Pain:  Vitals:   02/13/22 0758  TempSrc: Oral  PainSc:                  Windell Norfolk

## 2022-02-13 NOTE — Transfer of Care (Signed)
Immediate Anesthesia Transfer of Care Note  Patient: Logan Garrett  Procedure(s) Performed: HERNIA REPAIR UMBILICAL ADULT  Patient Location: ICU  Anesthesia Type:General  Level of Consciousness: awake and alert   Airway & Oxygen Therapy: Patient Spontanous Breathing  Post-op Assessment: Report given to RN and Post -op Vital signs reviewed and stable  Post vital signs: Reviewed and stable  Last Vitals:  Vitals Value Taken Time  BP    Temp    Pulse    Resp    SpO2      Last Pain:  Vitals:   02/13/22 0758  TempSrc: Oral  PainSc:          Complications: No notable events documented.

## 2022-02-13 NOTE — Progress Notes (Signed)
[facility]  Patient's leukocytosis jumped. No increase in pain. Will need to take him back to the OR for incarcerated hernia repair. Discussed with him risk of bleeding, infection, need for bowel resection if bowel looks compromised, he does not want to do any mesh and discussed with him the increased risk of recurrence of 40% without mesh. Discussed primary repair. If he needs bowel resection he would get primary repair anyway.  Will classify this as emergency given his electrolytes. Discussed with anesthesia.  Algis Greenhouse, MD [facility] [address] [place] R[place], [place] [address] [phone] (office)

## 2022-02-13 NOTE — Anesthesia Preprocedure Evaluation (Addendum)
Anesthesia Evaluation  Patient identified by MRN, date of birth, ID band Patient awake    Reviewed: Allergy & Precautions, H&P , NPO status , Patient's Chart, lab work & pertinent test results, reviewed documented beta blocker date and time   Airway Mallampati: II  TM Distance: >3 FB Neck ROM: full    Dental no notable dental hx. (+) Teeth Intact   Pulmonary neg pulmonary ROS,    Pulmonary exam normal breath sounds clear to auscultation       Cardiovascular Exercise Tolerance: Good hypertension, negative cardio ROS   Rhythm:regular Rate:Normal     Neuro/Psych PSYCHIATRIC DISORDERS Depression negative neurological ROS     GI/Hepatic negative GI ROS, Neg liver ROS,   Endo/Other  negative endocrine ROS  Renal/GU ARFnegative Renal ROS  negative genitourinary   Musculoskeletal   Abdominal   Peds  Hematology negative hematology ROS (+)   Anesthesia Other Findings Extremely high risk due to hyponatremia.  Required to proceed due to emergency nature of surgery.    Reproductive/Obstetrics negative OB ROS                            Anesthesia Physical Anesthesia Plan  ASA: 4 and emergent  Anesthesia Plan: General and General ETT   Post-op Pain Management:    Induction:   PONV Risk Score and Plan: Ondansetron  Airway Management Planned:   Additional Equipment:   Intra-op Plan:   Post-operative Plan:   Informed Consent: I have reviewed the patients History and Physical, chart, labs and discussed the procedure including the risks, benefits and alternatives for the proposed anesthesia with the patient or authorized representative who has indicated his/her understanding and acceptance.     Dental Advisory Given  Plan Discussed with: CRNA  Anesthesia Plan Comments:         Anesthesia Quick Evaluation

## 2022-02-13 NOTE — Progress Notes (Signed)
PROGRESS NOTE    Logan Garrett  XBJ:[id number] DOB: 1971-04-17 DOA: 02/11/2022 PCP: Pcp, No    Brief Narrative:  51 year old male with a history of hypertension, hyperlipidemia who is currently an inmate at a correctional facility.  Was brought to the emergency room after an unwitnessed fall.  Reported that he was dizzy.  Noted to have bruising around his right orbit.  Labs showed acute kidney injury, hyponatremia.  He was also hypotensive on arrival.  Admitted for IV fluids.  Had abdominal pain and vomiting.  Abdominal x-ray showing concern for possible SBO.  General surgery consulted.   Assessment & Plan:   Principal Problem:   Hyponatremia Active Problems:   AKI (acute kidney injury) (HCC)   Syncope   Dehydration   Hyperlipidemia   Periorbital hematoma   Fall at home, initial encounter   SBO (small bowel obstruction) (HCC)   Hypokalemia   Incarcerated umbilical hernia   Syncope Fall Periorbital hematoma -Likely related to hypovolemia/hypotension -Continue IV hydration -Unclear whether patient truly had a seizure, sounds more like post syncopal convulsion -Overall sodium level is improving -EEG did not show any epileptiform discharges -CT head without any intracranial abnormality -Continue to monitor for any recurrence   Hyponatremia -Related to hypovolemia, decrease p.o. intake -He has been started on saline with improvement of sodium -Nephrology following -Monitor sodium every 4 hours -Urine sodium is 15 -Serum osmolarity 275  Hypokalemia -Replace -Magnesium normal  Hypotension -This is related to hypovolemia in the setting of renal failure and dehydration -Continue volume resuscitation   Acute kidney injury -Due to hypovolemia in the setting of ARB -Admission creatinine 2.54 -Current creatinine 1.13 -Continue IV fluids -Renal ultrasound negative for hydronephrosis  Small bowel obstruction and incarcerated umbilical hernia -Seen by general surgery  and underwent operative management 6/10 -Continue postop care per general surgery   Hyperlipidemia -Resume statin when able to take p.o.   DVT prophylaxis: heparin injection 5,000 Units Start: 02/11/22 2200  Code Status: Full code Family Communication: Discussed with patient Disposition Plan: Status is: Inpatient Remains inpatient appropriate because: Continued management of hyponatremia, renal failure and bowel obstruction     Consultants:  Nephrology General surgery  Procedures:  Primary medical hernia repair, reduction of small bowel  Antimicrobials:      Subjective: Patient seen in his room postoperatively.  Says he overall is uncomfortable in the bed.  Having pain.  Objective: Vitals:   02/13/22 1045 02/13/22 1100 02/13/22 1115 02/13/22 1130  BP: (!) 81/48 96/72 101/63 (!) 87/55  Pulse: (!) 104 92 90 89  Resp:  (!) Temp:    97.6 F (36.4 C)  TempSrc:    Oral  SpO2: 92% 97% 97% 95%  Weight:      Height:        Intake/Output Summary (Last 24 hours) at 02/13/2022 1244 Last data filed at 02/13/2022 1029 Gross per 24 hour  Intake 4103.72 ml  Output 1580 ml  Net 2523.72 ml   Filed Weights   02/11/22 0934 02/12/22 0500  Weight: 81.6 kg 79.8 kg    Examination:  General exam: Appears calm and comfortable  HEENT: bruising around right eye Respiratory system: Clear to auscultation. Respiratory effort normal. Cardiovascular system: S1 & S2 heard, RRR. No JVD, murmurs, rubs, gallops or clicks. No pedal edema. Gastrointestinal system: Abdomen is nondistended, soft and diffusely tender.  Central nervous system: Alert and oriented. No focal neurological deficits. Extremities: Symmetric 5 x 5 power. Skin: No rashes, lesions or  ulcers Psychiatry: Judgement and insight appear normal. Mood & affect appropriate.     Data Reviewed: I have personally reviewed following labs and imaging studies  CBC: Recent Labs  Lab 02/11/22 0952 02/12/22 0358  02/13/22 0358  WBC 13.3* 15.1* 19.3*  NEUTROABS 9.1*  --   --   HGB 18.2* 17.0 15.3  HCT 50.4 45.8 41.8  MCV 86.7 87.6 88.9  PLT 329 237 264   Basic Metabolic Panel: Recent Labs  Lab 02/11/22 0952 02/11/22 1218 02/11/22 1851 02/12/22 0358 02/12/22 0939 02/12/22 1347 02/12/22 1750 02/12/22 2352 02/13/22 0358 02/13/22 0856  NA 115* 117*   < > 121*   < > 123* 125* 124* 124* 125*  K 2.8* 3.0*  --  2.9*  --   --   --   --  3.2*  --   CL 70* 77*  --  84*  --   --   --   --  90*  --   CO2 26 25  --  25  --   --   --   --  28  --   GLUCOSE 127* 104*  --  101*  --   --   --   --  128*  --   BUN 92* 87*  --  57*  --   --   --   --  33*  --   CREATININE 2.54* 2.07*  --  1.51*  --   --   --   --  1.13  --   CALCIUM 9.0 8.1*  --  8.3*  --   --   --   --  8.0*  --   MG 2.8*  --   --  2.4  --   --   --   --   --   --    < > = values in this interval not displayed.   GFR: Estimated Creatinine Clearance: 86.3 mL/min (by C-G formula based on SCr of 1.13 mg/dL). Liver Function Tests: Recent Labs  Lab 02/12/22 0358  AST 19  ALT 16  ALKPHOS 52  BILITOT 1.8*  PROT 6.1*  ALBUMIN 3.3*   No results for input(s): "LIPASE", "AMYLASE" in the last 168 hours. No results for input(s): "AMMONIA" in the last 168 hours. Coagulation Profile: No results for input(s): "INR", "PROTIME" in the last 168 hours. Cardiac Enzymes: Recent Labs  Lab 02/11/22 0952  CKTOTAL 484*   BNP (last 3 results) No results for input(s): "PROBNP" in the last 8760 hours. HbA1C: No results for input(s): "HGBA1C" in the last 72 hours. CBG: No results for input(s): "GLUCAP" in the last 168 hours. Lipid Profile: No results for input(s): "CHOL", "HDL", "LDLCALC", "TRIG", "CHOLHDL", "LDLDIRECT" in the last 72 hours. Thyroid Function Tests: Recent Labs    02/11/22 1117  TSH 0.729   Anemia Panel: No results for input(s): "VITAMINB12", "FOLATE", "FERRITIN", "TIBC", "IRON", "RETICCTPCT" in the last 72 hours. Sepsis  Labs: Recent Labs  Lab 02/11/22 0737 02/11/22 1117  LATICACIDVEN 2.2* 1.5    Recent Results (from the past 240 hour(s))  Resp Panel by RT-PCR (Flu A&B, Covid) Anterior Nasal Swab     Status: None   Collection Time: 02/11/22  9:52 AM   Specimen: Anterior Nasal Swab  Result Value Ref Range Status   SARS Coronavirus 2 by RT PCR NEGATIVE NEGATIVE Final    Comment: (NOTE) SARS-CoV-2 target nucleic acids are NOT DETECTED.  The SARS-CoV-2 RNA is generally detectable in upper respiratory  specimens during the acute phase of infection. The lowest concentration of SARS-CoV-2 viral copies this assay can detect is 138 copies/mL. A negative result does not preclude SARS-Cov-2 infection and should not be used as the sole basis for treatment or other patient management decisions. A negative result may occur with  improper specimen collection/handling, submission of specimen other than nasopharyngeal swab, presence of viral mutation(s) within the areas targeted by this assay, and inadequate number of viral copies(<138 copies/mL). A negative result must be combined with clinical observations, patient history, and epidemiological information. The expected result is Negative.  Fact Sheet for Patients:  BloggerCourse.com  Fact Sheet for Healthcare Providers:  SeriousBroker.it  This test is no t yet approved or cleared by the [place] FDA and  has been authorized for detection and/or diagnosis of SARS-CoV-2 by FDA under an Emergency Use Authorization (EUA). This EUA will remain  in effect (meaning this test can be used) for the duration of the COVID-19 declaration under Section 564(b)(1) of the Act, 21 U.S.C.section 360bbb-3(b)(1), unless the authorization is terminated  or revoked sooner.       Influenza A by PCR NEGATIVE NEGATIVE Final   Influenza B by PCR NEGATIVE NEGATIVE Final    Comment: (NOTE) The Xpert Xpress SARS-CoV-2/FLU/RSV  plus assay is intended as an aid in the diagnosis of influenza from Nasopharyngeal swab specimens and should not be used as a sole basis for treatment. Nasal washings and aspirates are unacceptable for Xpert Xpress SARS-CoV-2/FLU/RSV testing.  Fact Sheet for Patients: BloggerCourse.com  Fact Sheet for Healthcare Providers: SeriousBroker.it  This test is not yet approved or cleared by the [place] FDA and has been authorized for detection and/or diagnosis of SARS-CoV-2 by FDA under an Emergency Use Authorization (EUA). This EUA will remain in effect (meaning this test can be used) for the duration of the COVID-19 declaration under Section 564(b)(1) of the Act, 21 U.S.C. section 360bbb-3(b)(1), unless the authorization is terminated or revoked.  Performed at [facility], [address], [place], [place] [address]   MRSA Next Gen by PCR, Nasal     Status: None   Collection Time: 02/11/22  6:30 PM   Specimen: Nasal Mucosa; Nasal Swab  Result Value Ref Range Status   MRSA by PCR Next Gen NOT DETECTED NOT DETECTED Final    Comment: (NOTE) The GeneXpert MRSA Assay (FDA approved for NASAL specimens only), is one component of a comprehensive MRSA colonization surveillance program. It is not intended to diagnose MRSA infection nor to guide or monitor treatment for MRSA infections. Test performance is not FDA approved in patients less than 40 years old. Performed at [facility], [address], [place], [place] [address]          Radiology Studies: CT ABDOMEN PELVIS W CONTRAST  Result Date: 02/12/2022 CLINICAL DATA:  Concern for bowel obstruction. EXAM: CT ABDOMEN AND PELVIS WITH CONTRAST TECHNIQUE: Multidetector CT imaging of the abdomen and pelvis was performed using the standard protocol following bolus administration of intravenous contrast. RADIATION DOSE REDUCTION: This exam was performed according to the departmental  dose-optimization program which includes automated exposure control, adjustment of the mA and/or kV according to patient size and/or use of iterative reconstruction technique. CONTRAST:  OMNIPAQUE IOHEXOL 300 MG/ML  SOLN COMPARISON:  Abdominal radiograph dated 02/11/2022 and CT of the chest abdomen pelvis dated 04/13/2012. FINDINGS: Lower chest: Large area of airspace opacity involving the visualized left lung base most concerning for pneumonia or aspiration. Clinical correlation and follow-up  is recommended. There is coronary vascular calcification. No intra-abdominal free air or free fluid. Hepatobiliary: Small cysts in the left lobe of the liver measuring up to 15 mm. Additional subcentimeter hypodense lesion is too small to characterize. No intrahepatic biliary ductal dilatation. The gallbladder is unremarkable. Pancreas: Unremarkable. No pancreatic ductal dilatation or surrounding inflammatory changes. Spleen: Normal in size without focal abnormality. Adrenals/Urinary Tract: The adrenal glands are unremarkable. There is no hydronephrosis on either side. There is symmetric enhancement and excretion of contrast by both kidneys. The visualized ureters and urinary bladder appear unremarkable. Stomach/Bowel: There is herniation of a loop of small bowel into the umbilicus. There is pinching of the herniated bowel at the neck of the hernia with associated small bowel obstruction. The small bowel loops proximal to the hernia are dilated measuring up to 5.5 cm in caliber. The distal small bowel are collapsed. The colon is unremarkable. The appendix is normal. Vascular/Lymphatic: The abdominal aorta and IVC are unremarkable. No portal venous gas. There is no adenopathy. Reproductive: The prostate and seminal vesicles are grossly unremarkable. No pelvic mass. Other: None Musculoskeletal: Degenerative changes of the spine. No acute osseous pathology. IMPRESSION: 1. Small-bowel obstruction secondary to umbilical  hernia. Surgical consult is advised. 2. Left lung base pneumonia or aspiration.  Follow-up recommended. Electronically Signed   By: Elgie Collard M.D.   On: 02/12/2022 21:35   EEG adult  Result Date: 02/12/2022 Charlsie Quest, MD     02/12/2022  6:27 PM Patient Name: Logan Garrett MRN: [id number] Epilepsy Attending: Charlsie Quest Referring Physician/Provider: Lilyan Gilford, DO Date: 02/12/2022 Duration: 22.35 mins Patient history: 51yo M undergoing eeg to evaluate for seizure Level of alertness: Awake, asleep AEDs during EEG study: None Technical aspects: This EEG study was done with scalp electrodes positioned according to the 10-20 International system of electrode placement. Electrical activity was acquired at a sampling rate of  and reviewed with a high frequency filter of  and a low frequency filter of . EEG data were recorded continuously and digitally stored. Description: The posterior dominant rhythm consists of 8-9 Hz activity of moderate voltage (25-35 uV) seen predominantly in posterior head regions, symmetric and reactive to eye opening and eye closing. Sleep was characterized by vertex waves, sleep spindles (12 to 14 Hz), maximal frontocentral region. Hyperventilation and photic stimulation were not performed.   IMPRESSION: This study is within normal limits. No seizures or epileptiform discharges were seen throughout the recording. Charlsie Quest   DG Abd 1 View  Result Date: 02/11/2022 CLINICAL DATA:  Abdominal pain. EXAM: ABDOMEN - 1 VIEW COMPARISON:  None Available. FINDINGS: There are dilated air-filled small bowel loops in the upper abdomen measuring up to 5 cm. The stomach is also dilated and air-filled. Minimal colonic gas identified. There is a phlebolith in the left hemipelvis. No acute fractures are seen. There are degenerative changes of the lower lumbar spine. Visualized lung bases are clear. IMPRESSION: 1. Dilated proximal small bowel and stomach worrisome  for small bowel obstruction. Electronically Signed   By: Darliss Cheney M.D.   On: 02/11/2022 23:21   US RENAL  Result Date: 02/11/2022 CLINICAL DATA:  Renal dysfunction EXAM: RENAL / URINARY TRACT ULTRASOUND COMPLETE COMPARISON:  CT abdomen done on 04/13/2012 FINDINGS: Right Kidney: Renal measurements: 11.9 x 7.5 x 6.8 cm = volume: 315.1 mL. There is no hydronephrosis. There is increased cortical echogenicity. Left Kidney: Renal measurements: 12.5 x 6.2 by 6.1 cm = volume: 249.1 mL. There is  no hydronephrosis. There is increased cortical echogenicity. Technologist observed 6 mm hyperechoic focus in the upper pole of left kidney. This may suggest nonobstructing calculus or partial volume averaging of renal sinus fat. Bladder: Unremarkable. Other: None. IMPRESSION: There is no hydronephrosis. There is increased cortical echogenicity in the kidneys suggesting possible medical renal disease. 6 mm hyperechoic focus in the left kidney may suggest nonobstructing calculus or partial volume averaging artifact. Electronically Signed   By: Ernie AvenaPalani  Rathinasamy M.D.   On: 02/11/2022 13:53        Scheduled Meds:  Chlorhexidine Gluconate Cloth  6 each Topical Daily   heparin  5,000 Units Subcutaneous Q8H   Continuous Infusions:  sodium chloride 125 mL/hr at 02/13/22 1124   sodium chloride Stopped (02/12/22 1801)   norepinephrine (LEVOPHED) Adult infusion Stopped (02/13/22 1059)   piperacillin-tazobactam (ZOSYN)  IV     potassium chloride 10 mEq (02/13/22 1224)     LOS: 2 days    Time spent: 30mins    Erick BlinksJehanzeb Mahli Glahn, MD [facility]   If 7PM-7AM, please contact night-coverage [email or web address]  02/13/2022, 12:44 PM

## 2022-02-13 NOTE — Op Note (Signed)
[facility] Operative Note  02/13/22  Preoperative Diagnosis: Incarcerated umbilical hernia and small bowel obstruction   Postoperative Diagnosis: Same   Procedure(s) Performed: Primary Umbilical hernia repair 3cm defect, reduction of small bowel    Surgeon: Lanell Matar. Constance Haw, MD   Assistants: No qualified resident was available    Anesthesia: General endotracheal   Anesthesiologist: Louann Sjogren, MD    Specimens: Hernia sac   Estimated Blood Loss: Minimal   Blood Replacement: None    Complications: None   Wound Class: Clean   Operative Indications: Mr. Jourdan is a 51 yo with a small bowel obstruction related to an umbilical hernia. I was able to get most of it reduced last night but this morning it had recurred and he has a leukocytosis. We discussed OR and risk of bleeding, infection, recurrence, need for bowel resection. He did not want mesh used so he agreed to a permanent suture repair.   Findings: Incarcerated umbilical hernia with small bowel, bowel mildly ischemic but healthy and viable after reduction, 1cm defect in fashion, opened to 96m superiorly to reduce the bowel    Procedure: The patient was taken to the operating room and placed supine. General endotracheal anesthesia was induced. Intravenous antibiotics were  administered per protocol.  The abdomen was prepared and draped in the usual sterile fashion.   The umbilical hernia was noted to be incarcerated. An incision was made above the umbilicus, and carried down through the subcutaneous tissue with electrocautery.  Dissection was performed down to the level of the fascia, exposing the hernia sac. The hernia was tight and the defect was less than 1cm. I was able to get a hemostat between the hernia sac and the fascia and opened it superior for another 2cm to allow the bowel to be manipulated and reduced.   The hernia sac was opened with care, and excess hernia sac was resected with  electrocautery. The bowel was adherent to the sac and this was carefully dissected off with scissors.  A finger was ran on the underlying peritoneum and this was clear.  The bowel was mildly ischemic but turned viable and healthy with reduction and no signs of any narrowing or stricturing.   The hernia defect was then closed with 0 Ethibond suture in an interrupted fashion, closing the defect vertically as I had extended the defect superiorly to get the bowel to reduce. The umbilical stalk was still tacked down. I used 3-0 Vicryl suture to close the space.   Hemostasis was confirmed. The skin was closed with a running 4-0 Monocryl suture and dermabond.  After the dermabond dried a 2X2 and tegaderm were placed over the umbilicus to act as a pressure dressing.    All counts were correct at the end of the case. The patient was awakened from anesthesia and extubated without complication.  The patient went to the PACU in stable condition.  Curlene Labrum, MD [facility] [address] [place], [place] [address] [phone] (office)

## 2022-02-14 DIAGNOSIS — N179 Acute kidney failure, unspecified: Secondary | ICD-10-CM | POA: Diagnosis not present

## 2022-02-14 DIAGNOSIS — E860 Dehydration: Secondary | ICD-10-CM | POA: Diagnosis not present

## 2022-02-14 DIAGNOSIS — E782 Mixed hyperlipidemia: Secondary | ICD-10-CM | POA: Diagnosis not present

## 2022-02-14 DIAGNOSIS — E871 Hypo-osmolality and hyponatremia: Secondary | ICD-10-CM | POA: Diagnosis not present

## 2022-02-14 LAB — BASIC METABOLIC PANEL
Anion gap: 4 — ABNORMAL LOW (ref 5–15)
BUN: 17 mg/dL (ref 6–20)
CO2: 23 mmol/L (ref 22–32)
Calcium: 7.5 mg/dL — ABNORMAL LOW (ref 8.9–10.3)
Chloride: 98 mmol/L (ref 98–111)
Creatinine, Ser: 1.02 mg/dL (ref 0.61–1.24)
GFR, Estimated: >60 mL/min (ref >60)
Glucose, Bld: 92 mg/dL (ref 70–99)
Potassium: 3.6 mmol/L (ref 3.5–5.1)
Sodium: 125 mmol/L — ABNORMAL LOW (ref 135–145)

## 2022-02-14 LAB — CBC
HCT: 37.8 % — ABNORMAL LOW (ref 39.0–52.0)
Hemoglobin: 13.5 g/dL (ref 13.0–17.0)
MCH: 32.8 pg (ref 26.0–34.0)
MCHC: 35.7 g/dL (ref 30.0–36.0)
MCV: 91.7 fL (ref 80.0–100.0)
Platelets: 246 10*3/uL (ref 150–400)
RBC: 4.12 MIL/uL — ABNORMAL LOW (ref 4.22–5.81)
RDW: 11.7 % (ref 11.5–15.5)
WBC: 14 10*3/uL — ABNORMAL HIGH (ref 4.0–10.5)
nRBC: 0 % (ref 0.0–0.2)

## 2022-02-14 LAB — SODIUM
Sodium: 127 mmol/L — ABNORMAL LOW (ref 135–145)
Sodium: 128 mmol/L — ABNORMAL LOW (ref 135–145)

## 2022-02-14 MED ORDER — ATORVASTATIN CALCIUM 10 MG PO TABS
20.0000 mg | ORAL_TABLET | Freq: Every day | ORAL | Status: DC
  Administered 2022-02-15 – 2022-02-17 (×3): 20 mg via ORAL
  Filled 2022-02-14: qty 1
  Filled 2022-02-14 (×2): qty 2

## 2022-02-14 NOTE — Progress Notes (Signed)
PROGRESS NOTE    Logan Garrett  QPY:[id number] DOB: 1971/04/15 DOA: 02/11/2022 PCP: Pcp, No    Brief Narrative:  51 year old male with a history of hypertension, hyperlipidemia who is currently an inmate at a correctional facility.  Was brought to the emergency room after an unwitnessed fall.  Reported that he was dizzy.  Noted to have bruising around his right orbit.  Labs showed acute kidney injury, hyponatremia.  He was also hypotensive on arrival.  Admitted for IV fluids.  Had abdominal pain and vomiting.  Abdominal x-ray showing concern for possible SBO.  General surgery consulted.   Assessment & Plan:   Principal Problem:   Hyponatremia Active Problems:   AKI (acute kidney injury) (HCC)   Syncope   Dehydration   Hyperlipidemia   Periorbital hematoma   Fall at home, initial encounter   SBO (small bowel obstruction) (HCC)   Hypokalemia   Incarcerated umbilical hernia   Syncope Fall Periorbital hematoma -Likely related to hypovolemia/hypotension -Continue IV hydration -Unclear whether patient truly had a seizure, sounds more like post syncopal convulsion -Overall sodium level is improving -EEG did not show any epileptiform discharges -CT head without any intracranial abnormality -Continue to monitor for any recurrence   Hyponatremia -Related to hypovolemia, decrease p.o. intake -Sodium 115 on admission -He has been started on saline with improvement of sodium -Nephrology following -Currently, sodium has improved to 128 -Continue current treatments -Urine sodium is 15 -Serum osmolarity 275  Hypokalemia -Replace -Magnesium normal  Hypotension with hypovolemic shock -This is related to hypovolemia in the setting of renal failure and dehydration -Continue volume resuscitation -Briefly required vasopressors, now weaned off   Acute kidney injury -Due to hypovolemia in the setting of ARB -Admission creatinine 2.54 -Current creatinine 1.\02 -Continue IV  fluids -Renal ultrasound negative for hydronephrosis  Small bowel obstruction and incarcerated umbilical hernia -Seen by general surgery and underwent operative management 6/10 -Continue postop care per general surgery -Advancing diet as tolerated   Hyperlipidemia -Continue Lipitor   DVT prophylaxis: heparin injection 5,000 Units Start: 02/11/22 2200  Code Status: Full code Family Communication: Discussed with patient Disposition Plan: Status is: Inpatient Remains inpatient appropriate because: Continued management of hyponatremia, renal failure and bowel obstruction     Consultants:  Nephrology General surgery  Procedures:  Primary medical hernia repair, reduction of small bowel  Antimicrobials:      Subjective: Has some nausea, but wishes to advance diet.  He is having bowel movements.  Reports that pain is reasonably controlled.  Objective: Vitals:   02/14/22 0811 02/14/22 1200 02/14/22 1544 02/14/22 1645  BP:      Pulse:    90  Resp:    18  Temp: 99 F (37.2 C) 97.7 F (36.5 C) 98.2 F (36.8 C)   TempSrc: Oral Oral Oral   SpO2:    100%  Weight:      Height:        Intake/Output Summary (Last 24 hours) at 02/14/2022 1807 Last data filed at 02/14/2022 1451 Gross per 24 hour  Intake 1350.4 ml  Output --  Net 1350.4 ml   Filed Weights   02/11/22 0934 02/12/22 0500 02/14/22 0558  Weight: 81.6 kg 79.8 kg 77.8 kg    Examination:  General exam: Appears calm and comfortable  HEENT: bruising around right eye Respiratory system: Clear to auscultation. Respiratory effort normal. Cardiovascular system: S1 & S2 heard, RRR. No JVD, murmurs, rubs, gallops or clicks. No pedal edema. Gastrointestinal system: Abdomen is nondistended, soft and  diffusely tender.  Central nervous system: Alert and oriented. No focal neurological deficits. Extremities: Symmetric 5 x 5 power. Skin: No rashes, lesions or ulcers Psychiatry: Judgement and insight appear normal. Mood &  affect appropriate.     Data Reviewed: I have personally reviewed following labs and imaging studies  CBC: Recent Labs  Lab 02/11/22 0952 02/12/22 0358 02/13/22 0358 02/14/22 0245  WBC 13.3* 15.1* 19.3* 14.0*  NEUTROABS 9.1*  --   --   --   HGB 18.2* 17.0 15.3 13.5  HCT 50.4 45.8 41.8 37.8*  MCV 86.7 87.6 88.9 91.7  PLT 329 237 264 246   Basic Metabolic Panel: Recent Labs  Lab 02/11/22 0952 02/11/22 1218 02/11/22 1851 02/12/22 0358 02/12/22 0939 02/13/22 0358 02/13/22 0856 02/13/22 1759 02/13/22 2321 02/14/22 0245 02/14/22 0640 02/14/22 1053  NA 115* 117*   < > 121*   < > 124*   < > 123* 128* 125* 127* 128*  K 2.8* 3.0*  --  2.9*  --  3.2*  --   --   --  3.6  --   --   CL 70* 77*  --  84*  --  90*  --   --   --  98  --   --   CO2 26 25  --  25  --  28  --   --   --  23  --   --   GLUCOSE 127* 104*  --  101*  --  128*  --   --   --  92  --   --   BUN 92* 87*  --  57*  --  33*  --   --   --  17  --   --   CREATININE 2.54* 2.07*  --  1.51*  --  1.13  --   --   --  1.02  --   --   CALCIUM 9.0 8.1*  --  8.3*  --  8.0*  --   --   --  7.5*  --   --   MG 2.8*  --   --  2.4  --   --   --   --   --   --   --   --    < > = values in this interval not displayed.   GFR: Estimated Creatinine Clearance: 93.2 mL/min (by C-G formula based on SCr of 1.02 mg/dL). Liver Function Tests: Recent Labs  Lab 02/12/22 0358  AST 19  ALT 16  ALKPHOS 52  BILITOT 1.8*  PROT 6.1*  ALBUMIN 3.3*   No results for input(s): "LIPASE", "AMYLASE" in the last 168 hours. No results for input(s): "AMMONIA" in the last 168 hours. Coagulation Profile: No results for input(s): "INR", "PROTIME" in the last 168 hours. Cardiac Enzymes: Recent Labs  Lab 02/11/22 0952  CKTOTAL 484*   BNP (last 3 results) No results for input(s): "PROBNP" in the last 8760 hours. HbA1C: No results for input(s): "HGBA1C" in the last 72 hours. CBG: No results for input(s): "GLUCAP" in the last 168 hours. Lipid  Profile: No results for input(s): "CHOL", "HDL", "LDLCALC", "TRIG", "CHOLHDL", "LDLDIRECT" in the last 72 hours. Thyroid Function Tests: No results for input(s): "TSH", "T4TOTAL", "FREET4", "T3FREE", "THYROIDAB" in the last 72 hours.  Anemia Panel: No results for input(s): "VITAMINB12", "FOLATE", "FERRITIN", "TIBC", "IRON", "RETICCTPCT" in the last 72 hours. Sepsis Labs: Recent Labs  Lab 02/11/22 0952 02/11/22 1117  LATICACIDVEN 2.2* 1.5  Recent Results (from the past 240 hour(s))  Resp Panel by RT-PCR (Flu A&B, Covid) Anterior Nasal Swab     Status: None   Collection Time: 02/11/22  9:52 AM   Specimen: Anterior Nasal Swab  Result Value Ref Range Status   SARS Coronavirus 2 by RT PCR NEGATIVE NEGATIVE Final    Comment: (NOTE) SARS-CoV-2 target nucleic acids are NOT DETECTED.  The SARS-CoV-2 RNA is generally detectable in upper respiratory specimens during the acute phase of infection. The lowest concentration of SARS-CoV-2 viral copies this assay can detect is 138 copies/mL. A negative result does not preclude SARS-Cov-2 infection and should not be used as the sole basis for treatment or other patient management decisions. A negative result may occur with  improper specimen collection/handling, submission of specimen other than nasopharyngeal swab, presence of viral mutation(s) within the areas targeted by this assay, and inadequate number of viral copies(<138 copies/mL). A negative result must be combined with clinical observations, patient history, and epidemiological information. The expected result is Negative.  Fact Sheet for Patients:  BloggerCourse.com  Fact Sheet for Healthcare Providers:  SeriousBroker.it  This test is no t yet approved or cleared by the [place] FDA and  has been authorized for detection and/or diagnosis of SARS-CoV-2 by FDA under an Emergency Use Authorization (EUA). This EUA will remain   in effect (meaning this test can be used) for the duration of the COVID-19 declaration under Section 564(b)(1) of the Act, 21 U.S.C.section 360bbb-3(b)(1), unless the authorization is terminated  or revoked sooner.       Influenza A by PCR NEGATIVE NEGATIVE Final   Influenza B by PCR NEGATIVE NEGATIVE Final    Comment: (NOTE) The Xpert Xpress SARS-CoV-2/FLU/RSV plus assay is intended as an aid in the diagnosis of influenza from Nasopharyngeal swab specimens and should not be used as a sole basis for treatment. Nasal washings and aspirates are unacceptable for Xpert Xpress SARS-CoV-2/FLU/RSV testing.  Fact Sheet for Patients: BloggerCourse.com  Fact Sheet for Healthcare Providers: SeriousBroker.it  This test is not yet approved or cleared by the [place] FDA and has been authorized for detection and/or diagnosis of SARS-CoV-2 by FDA under an Emergency Use Authorization (EUA). This EUA will remain in effect (meaning this test can be used) for the duration of the COVID-19 declaration under Section 564(b)(1) of the Act, 21 U.S.C. section 360bbb-3(b)(1), unless the authorization is terminated or revoked.  Performed at [facility], [address], [place], [place] [address]   MRSA Next Gen by PCR, Nasal     Status: None   Collection Time: 02/11/22  6:30 PM   Specimen: Nasal Mucosa; Nasal Swab  Result Value Ref Range Status   MRSA by PCR Next Gen NOT DETECTED NOT DETECTED Final    Comment: (NOTE) The GeneXpert MRSA Assay (FDA approved for NASAL specimens only), is one component of a comprehensive MRSA colonization surveillance program. It is not intended to diagnose MRSA infection nor to guide or monitor treatment for MRSA infections. Test performance is not FDA approved in patients less than 52 years old. Performed at [facility], [address] [place], [place] [address]          Radiology Studies: CT ABDOMEN  PELVIS W CONTRAST  Result Date: 02/12/2022 CLINICAL DATA:  Concern for bowel obstruction. EXAM: CT ABDOMEN AND PELVIS WITH CONTRAST TECHNIQUE: Multidetector CT imaging of the abdomen and pelvis was performed using the standard protocol following bolus administration of intravenous contrast. RADIATION DOSE REDUCTION: This exam  was performed according to the departmental dose-optimization program which includes automated exposure control, adjustment of the mA and/or kV according to patient size and/or use of iterative reconstruction technique. CONTRAST:  100mL OMNIPAQUE IOHEXOL 300 MG/ML  SOLN COMPARISON:  Abdominal radiograph dated 02/11/2022 and CT of the chest abdomen pelvis dated 04/13/2012. FINDINGS: Lower chest: Large area of airspace opacity involving the visualized left lung base most concerning for pneumonia or aspiration. Clinical correlation and follow-up is recommended. There is coronary vascular calcification. No intra-abdominal free air or free fluid. Hepatobiliary: Small cysts in the left lobe of the liver measuring up to 15 mm. Additional subcentimeter hypodense lesion is too small to characterize. No intrahepatic biliary ductal dilatation. The gallbladder is unremarkable. Pancreas: Unremarkable. No pancreatic ductal dilatation or surrounding inflammatory changes. Spleen: Normal in size without focal abnormality. Adrenals/Urinary Tract: The adrenal glands are unremarkable. There is no hydronephrosis on either side. There is symmetric enhancement and excretion of contrast by both kidneys. The visualized ureters and urinary bladder appear unremarkable. Stomach/Bowel: There is herniation of a loop of small bowel into the umbilicus. There is pinching of the herniated bowel at the neck of the hernia with associated small bowel obstruction. The small bowel loops proximal to the hernia are dilated measuring up to 5.5 cm in caliber. The distal small bowel are collapsed. The colon is unremarkable. The appendix  is normal. Vascular/Lymphatic: The abdominal aorta and IVC are unremarkable. No portal venous gas. There is no adenopathy. Reproductive: The prostate and seminal vesicles are grossly unremarkable. No pelvic mass. Other: None Musculoskeletal: Degenerative changes of the spine. No acute osseous pathology. IMPRESSION: 1. Small-bowel obstruction secondary to umbilical hernia. Surgical consult is advised. 2. Left lung base pneumonia or aspiration.  Follow-up recommended. Electronically Signed   By: Elgie CollardArash  Radparvar M.D.   On: 02/12/2022 21:35   EEG adult  Result Date: 02/12/2022 Charlsie QuestYadav, Priyanka O, MD     02/12/2022  6:27 PM Patient Name: Thalia BloodgoodDouglas Testa MRN: [id number] Epilepsy Attending: Charlsie QuestPriyanka O Yadav Referring Physician/Provider: Lilyan GilfordZierle-Ghosh, Asia B, DO Date: 02/12/2022 Duration: 22.35 mins Patient history: 51yo M undergoing eeg to evaluate for seizure Level of alertness: Awake, asleep AEDs during EEG study: None Technical aspects: This EEG study was done with scalp electrodes positioned according to the 10-20 International system of electrode placement. Electrical activity was acquired at a sampling rate of 500Hz  and reviewed with a high frequency filter of 70Hz  and a low frequency filter of 1Hz . EEG data were recorded continuously and digitally stored. Description: The posterior dominant rhythm consists of 8-9 Hz activity of moderate voltage (25-35 uV) seen predominantly in posterior head regions, symmetric and reactive to eye opening and eye closing. Sleep was characterized by vertex waves, sleep spindles (12 to 14 Hz), maximal frontocentral region. Hyperventilation and photic stimulation were not performed.   IMPRESSION: This study is within normal limits. No seizures or epileptiform discharges were seen throughout the recording. Priyanka Annabelle Harman Yadav        Scheduled Meds:  Chlorhexidine Gluconate Cloth  6 each Topical Daily   heparin  5,000 Units Subcutaneous Q8H   Continuous Infusions:  sodium chloride  125 mL/hr at 02/14/22 1451   sodium chloride Stopped (02/12/22 1801)     LOS: 3 days    Time spent: 30mins    Erick BlinksJehanzeb Vern Guerette, MD [facility]   If 7PM-7AM, please contact night-coverage [email or web address]  02/14/2022, 6:07 PM

## 2022-02-14 NOTE — Progress Notes (Signed)
[facility]  Looking good. Had Bm. Still sore. Full liquids started.  BP (!) 118/93   Pulse 83   Temp 97.7 F (36.5 C) (Oral)   Resp 18   Ht 6\' 4"  (1.93 m)   Wt 77.8 kg   SpO2 99%   BMI 20.88 kg/m  Umbilical incision c/d/I with dermabond no erythema or drainage Appropriately tender, mildly distended   Patient s/p umbilical hernia repair and reduction of small bowel for incarcerated hernia. Doing better. PRN For pain IS, OOB Diet as tolerated Hopefully home in next 24 hours pending diet  , MD [facility] [address] [place], [place] [phone] (office)

## 2022-02-15 DIAGNOSIS — E860 Dehydration: Secondary | ICD-10-CM | POA: Diagnosis not present

## 2022-02-15 DIAGNOSIS — E871 Hypo-osmolality and hyponatremia: Secondary | ICD-10-CM | POA: Diagnosis not present

## 2022-02-15 DIAGNOSIS — N179 Acute kidney failure, unspecified: Secondary | ICD-10-CM | POA: Diagnosis not present

## 2022-02-15 DIAGNOSIS — E782 Mixed hyperlipidemia: Secondary | ICD-10-CM | POA: Diagnosis not present

## 2022-02-15 LAB — BASIC METABOLIC PANEL
Anion gap: 3 — ABNORMAL LOW (ref 5–15)
BUN: 7 mg/dL (ref 6–20)
CO2: 23 mmol/L (ref 22–32)
Calcium: 7.3 mg/dL — ABNORMAL LOW (ref 8.9–10.3)
Chloride: 104 mmol/L (ref 98–111)
Creatinine, Ser: 0.76 mg/dL (ref 0.61–1.24)
GFR, Estimated: >60 mL/min (ref >60)
Glucose, Bld: 111 mg/dL — ABNORMAL HIGH (ref 70–99)
Potassium: 3.5 mmol/L (ref 3.5–5.1)
Sodium: 130 mmol/L — ABNORMAL LOW (ref 135–145)

## 2022-02-15 LAB — CBC WITH DIFFERENTIAL/PLATELET
Abs Immature Granulocytes: 0.08 10*3/uL — ABNORMAL HIGH (ref 0.00–0.07)
Basophils Absolute: 0 10*3/uL (ref 0.0–0.1)
Basophils Relative: 0 %
Eosinophils Absolute: 0.2 10*3/uL (ref 0.0–0.5)
Eosinophils Relative: 2 %
HCT: 36.1 % — ABNORMAL LOW (ref 39.0–52.0)
Hemoglobin: 12.8 g/dL — ABNORMAL LOW (ref 13.0–17.0)
Immature Granulocytes: 1 %
Lymphocytes Relative: 17 %
Lymphs Abs: 1.7 10*3/uL (ref 0.7–4.0)
MCH: 32.7 pg (ref 26.0–34.0)
MCHC: 35.5 g/dL (ref 30.0–36.0)
MCV: 92.1 fL (ref 80.0–100.0)
Monocytes Absolute: 0.6 10*3/uL (ref 0.1–1.0)
Monocytes Relative: 6 %
Neutro Abs: 7.2 10*3/uL (ref 1.7–7.7)
Neutrophils Relative %: 74 %
Platelets: 270 10*3/uL (ref 150–400)
RBC: 3.92 MIL/uL — ABNORMAL LOW (ref 4.22–5.81)
RDW: 11.8 % (ref 11.5–15.5)
WBC: 9.8 10*3/uL (ref 4.0–10.5)
nRBC: 0 % (ref 0.0–0.2)

## 2022-02-15 MED ORDER — POTASSIUM CHLORIDE 20 MEQ PO PACK
40.0000 meq | PACK | Freq: Every day | ORAL | Status: DC
  Administered 2022-02-15 – 2022-02-17 (×3): 40 meq via ORAL
  Filled 2022-02-15 (×3): qty 2

## 2022-02-15 NOTE — Discharge Instructions (Signed)
Discharge Instructions Hernia:  Common Complaints: Pain at the incision site is common. This will improve with time. Take your pain medications as described below. Some nausea is common and poor appetite. The main goal is to stay hydrated the first few days after surgery.   Diet/ Activity: Diet as tolerated. You may not have an appetite, but it is important to stay hydrated.  Shower per your regular routine daily.  Do not take hot showers. Take warm showers that are less than 10 minutes. Rest and listen to your body, but do not remain in bed all day.Walk everyday for at least 15-20 minutes.  Deep cough and move around every 1-2 hours in the first few days after surgery. Do not pick at the dermabond glue on your incision sites.  This glue film will remain in place for 1-2 weeks and will start to peel off. Do not place lotions or balms on your incision unless instructed to specifically by Dr. Henreitta Leber. Do not lift > 10 lbs, perform excessive bending, pushing, pulling, squatting for 6-8 weeks after surgery. Where your abdominal binder with activity as much as possible. The activity restrictions and the abdominal binder are to prevent hernia formation at your incision while you are healing.   Pain Expectations and Narcotics: -After surgery you will have pain associated with your incisions and this is normal. The pain is muscular and nerve pain, and will get better with time. -You are encouraged and expected to take non narcotic medications like tylenol and ibuprofen (when able) to treat pain as multiple modalities can aid with pain treatment. -Narcotics are only used when pain is severe or there is breakthrough pain. -You are not expected to have a pain score of 0 after surgery, as we cannot prevent pain. A pain score of 3-4 that allows you to be functional, move, walk, and tolerate some activity is the goal. The pain will continue to improve over the days after surgery and is dependent on your  surgery. -Due to [place] law, we are only able to give a certain amount of pain medication to treat post operative pain, and we only give additional narcotics on a patient by patient basis.  -For most laparoscopic surgery, studies have shown that the majority of patients only need 10-15 narcotic pills, and for open surgeries most patients only need 15-20.   -Having appropriate expectations of pain and knowledge of pain management with non narcotics is important as we do not want anyone to become addicted to narcotic pain medication.  -Using ice packs in the first 48 hours and heating pads after 48 hours, wearing an abdominal binder (when recommended), and using over the counter medications are all ways to help with pain management.   -Simple acts like meditation and mindfulness practices after surgery can also help with pain control and research has proven the benefit of these practices.  Medication: Take tylenol and ibuprofen as needed for pain control, alternating every 4-6 hours.  Example:  Tylenol 1000mg  @ 6am, 12noon, 6pm, (Do not exceed 4000mg  of tylenol a day). Ibuprofen 800mg  @ 9am, 3pm, 9pm, 3am (Do not exceed 3600mg  of ibuprofen a day).  Take Roxicodone for breakthrough pain every 4 hours.  Take Colace for constipation related to narcotic pain medication. If you do not have a bowel movement in 2 days, take Miralax over the counter.  Drink plenty of water to also prevent constipation.   Contact Information: If you have questions or concerns, please call our office, [phone],  Monday- Thursday 8AM-5PM and Friday 8AM-12Noon.  If it is after hours or on the weekend, please call [facility] Main [facility], [phone], [phone], and ask to speak to the surgeon on call for Dr. Henreitta Leber at [facility].

## 2022-02-15 NOTE — Progress Notes (Signed)
[facility]  Eating and having Bms. Incision looks good. Plan for seeing patient in 1 month. He is not using pain meds so he can [facility] back to [facility] and not [place] if not requiring narcotics.  Defer to Dr. Kerry Hough for ultimate dc timing and location.  Future Appointments  Date Time Provider Department Center  03/11/2022  3:15 PM Lucretia Roers, MD [facility] None    Algis Greenhouse, MD [facility] [address] [place], [place] [address] [phone] (office)

## 2022-02-15 NOTE — Progress Notes (Addendum)
Patient ID: Lindsey Gruba, male   DOB: 1971/03/01, 51 y.o.   MRN: [id number] S: No new complaints O:BP (!) 127/92 (BP Location: Left Arm)   Pulse 90   Temp 98.4 F (36.9 C) (Oral)   Resp 17   Ht 6\' 4"  (1.93 m)   Wt 77.8 kg   SpO2 97%   BMI 20.88 kg/m   Intake/Output Summary (Last 24 hours) at 02/15/2022 [id number] Last data filed at 02/14/2022 2000 Gross per 24 hour  Intake 1420.4 ml  Output --  Net 1420.4 ml   Intake/Output: I/O last 3 completed shifts: In: 1450.4 [P.O.:430; I.V.:867.7; IV Piggyback:152.7] Out: -   Intake/Output this shift:  No intake/output data recorded. Weight change:  Gen: NAD CVS: RRR Resp: CTA Abd: +BS, soft, NT/ND Ext: no edema  Recent Labs  Lab 02/11/22 0952 02/11/22 1218 02/11/22 1851 02/12/22 0358 02/12/22 0939 02/13/22 0358 02/13/22 0856 02/13/22 1346 02/13/22 1759 02/13/22 2321 02/14/22 0245 02/14/22 0640 02/14/22 1053 02/15/22 0240  NA 115* 117*   < > 121*   < > 124*   < > 126* 123* 128* 125* 127* 128* 130*  K 2.8* 3.0*  --  2.9*  --  3.2*  --   --   --   --  3.6  --   --  3.5  CL 70* 77*  --  84*  --  90*  --   --   --   --  98  --   --  104  CO2 26 25  --  25  --  28  --   --   --   --  23  --   --  23  GLUCOSE 127* 104*  --  101*  --  128*  --   --   --   --  92  --   --  111*  BUN 92* 87*  --  57*  --  33*  --   --   --   --  17  --   --  7  CREATININE 2.54* 2.07*  --  1.51*  --  1.13  --   --   --   --  1.02  --   --  0.76  ALBUMIN  --   --   --  3.3*  --   --   --   --   --   --   --   --   --   --   CALCIUM 9.0 8.1*  --  8.3*  --  8.0*  --   --   --   --  7.5*  --   --  7.3*  AST  --   --   --  19  --   --   --   --   --   --   --   --   --   --   ALT  --   --   --  16  --   --   --   --   --   --   --   --   --   --    < > = values in this interval not displayed.   Liver Function Tests: Recent Labs  Lab 02/12/22 0358  AST 19  ALT 16  ALKPHOS 52  BILITOT 1.8*  PROT 6.1*  ALBUMIN 3.3*   No results for input(s):  "LIPASE", "AMYLASE" in the last 168 hours. No results  for input(s): "AMMONIA" in the last 168 hours. CBC: Recent Labs  Lab 02/11/22 0952 02/12/22 0358 02/13/22 0358 02/14/22 0245 02/15/22 0240  WBC 13.3* 15.1* 19.3* 14.0* 9.8  NEUTROABS 9.1*  --   --   --  7.2  HGB 18.2* 17.0 15.3 13.5 12.8*  HCT 50.4 45.8 41.8 37.8* 36.1*  MCV 86.7 87.6 88.9 91.7 92.1  PLT 329 237 264 246 270   Cardiac Enzymes: Recent Labs  Lab 02/11/22 0952  CKTOTAL 484*   CBG: No results for input(s): "GLUCAP" in the last 168 hours.  Iron Studies: No results for input(s): "IRON", "TIBC", "TRANSFERRIN", "FERRITIN" in the last 72 hours. Studies/Results: No results found.  atorvastatin  20 mg Oral Daily   Chlorhexidine Gluconate Cloth  6 each Topical Daily   heparin  5,000 Units Subcutaneous Q8H    BMET    Component Value Date/Time   NA 130 (L) 02/15/2022 0240   K 3.5 02/15/2022 0240   CL 104 02/15/2022 0240   CO2 23 02/15/2022 0240   GLUCOSE 111 (H) 02/15/2022 0240   BUN 7 02/15/2022 0240   CREATININE 0.76 02/15/2022 0240   CALCIUM 7.3 (L) 02/15/2022 0240   GFRNONAA >60 02/15/2022 0240   GFRAA >60 08/08/2018 1332   CBC    Component Value Date/Time   WBC 9.8 02/15/2022 0240   RBC 3.92 (L) 02/15/2022 0240   HGB 12.8 (L) 02/15/2022 0240   HCT 36.1 (L) 02/15/2022 0240   PLT 270 02/15/2022 0240   MCV 92.1 02/15/2022 0240   MCH 32.7 02/15/2022 0240   MCHC 35.5 02/15/2022 0240   RDW 11.8 02/15/2022 0240   LYMPHSABS 1.7 02/15/2022 0240   MONOABS 0.6 02/15/2022 0240   EOSABS 0.2 02/15/2022 0240   BASOSABS 0.0 02/15/2022 0240     Assessment/Plan:  AKI- secondary to ischemic ATN in setting of volume depletion and concomitant Ace inhibition.  Thankfully it has resolved with IVF's. Severe hypovolemic, hyponatremia - due to volume depletion.  Slowly improving with IVF's and is now up to 130.  Continue with IVF at 125/hr and follow sodium levels.   Hypokalemia - has been given multiple runs of  IV KCl.  Now that he is taking po, would start 42mEq of KCl daily and follow.  This also help increase sodium levels.  SBO with incarcerated umbilical hernia - s/p surgery 02/13/22.  Advancing diet as tolerated. Hypovolemic shock - improved and is off of pressors.  Disposition - not quite ready for discharge as he is requiring IVF's to maintain serum sodium levels.  Once able to decrease IVF's and maintain po intake with stable sodium levels, then he can be discharged.  Donetta Potts, MD [facility]

## 2022-02-15 NOTE — Plan of Care (Signed)

## 2022-02-15 NOTE — Progress Notes (Signed)
PROGRESS NOTE    Logan Garrett  ONG:[id number] DOB: Oct 28, 1970 DOA: 02/11/2022 PCP: Pcp, No    Brief Narrative:  51 year old male with a history of hypertension, hyperlipidemia who is currently an inmate at a correctional facility.  Was brought to the emergency room after an unwitnessed fall.  Reported that he was dizzy.  Noted to have bruising around his right orbit.  Labs showed acute kidney injury, hyponatremia.  He was also hypotensive on arrival.  Admitted for IV fluids.  Had abdominal pain and vomiting.  Abdominal x-ray showing concern for possible SBO.  General surgery consulted.  He was found to have umbilical hernia and concern for incarcerated bowel.  He underwent operative management.  Postop course has been unremarkable.  Still on IV fluids for hyponatremia.   Assessment & Plan:   Principal Problem:   Hyponatremia Active Problems:   AKI (acute kidney injury) (HCC)   Syncope   Dehydration   Hyperlipidemia   Periorbital hematoma   Fall at home, initial encounter   SBO (small bowel obstruction) (HCC)   Hypokalemia   Incarcerated umbilical hernia   Syncope Fall Periorbital hematoma -Likely related to hypovolemia/hypotension -Continue IV hydration -Unclear whether patient truly had a seizure, sounds more like post syncopal convulsion -Overall sodium level is improving -EEG did not show any epileptiform discharges -CT head without any intracranial abnormality -Continue to monitor for any recurrence   Hyponatremia -Related to hypovolemia, decrease p.o. intake -Sodium 115 on admission -He has been started on saline with improvement of sodium -Nephrology following -Currently, sodium has improved to 130 -We will start weaning off IV fluids and continuous to follow sodium -Urine sodium is 15 -Serum osmolarity 275  Hypokalemia -Replace -Magnesium normal  Hypotension with hypovolemic shock -This is related to hypovolemia in the setting of renal failure and  dehydration -Continue volume resuscitation -Briefly required vasopressors, now weaned off   Acute kidney injury -Due to hypovolemia in the setting of ARB -Admission creatinine 2.54 -Current creatinine 0.76 -Continue IV fluids -Renal ultrasound negative for hydronephrosis  Small bowel obstruction and incarcerated umbilical hernia -Seen by general surgery and underwent operative management 6/10 -Continue postop care per general surgery -Appears to be tolerating solid food -He is having bowel movements   Hyperlipidemia -Continue Lipitor   DVT prophylaxis: heparin injection 5,000 Units Start: 02/11/22 2200  Code Status: Full code Family Communication: Discussed with patient Disposition Plan: Status is: Inpatient Remains inpatient appropriate because: Continued management of hyponatremia     Consultants:  Nephrology General surgery  Procedures:  Primary medical hernia repair, reduction of small bowel  Antimicrobials:      Subjective: Doing well postoperatively.  Tolerating diet.  Having bowel movements.  Objective: Vitals:   02/15/22 1127 02/15/22 1417 02/15/22 1418 02/15/22 1848  BP: 117/80 117/80 117/80 112/75  Pulse: 73 89 92 80  Resp: 18 19 19 20   Temp: 97.7 F (36.5 C) 98.1 F (36.7 C) 98.1 F (36.7 C) 98.7 F (37.1 C)  TempSrc: Oral Oral Oral Oral  SpO2: 98% 97% 97% 98%  Weight:      Height:        Intake/Output Summary (Last 24 hours) at 02/15/2022 1903 Last data filed at 02/15/2022 1259 Gross per 24 hour  Intake 820 ml  Output --  Net 820 ml   Filed Weights   02/11/22 0934 02/12/22 0500 02/14/22 0558  Weight: 81.6 kg 79.8 kg 77.8 kg    Examination:  General exam: Appears calm and comfortable  HEENT: bruising around  right eye Respiratory system: Clear to auscultation. Respiratory effort normal. Cardiovascular system: S1 & S2 heard, RRR. No JVD, murmurs, rubs, gallops or clicks. No pedal edema. Gastrointestinal system: Abdomen is  nondistended, soft and diffusely tender.  Central nervous system: Alert and oriented. No focal neurological deficits. Extremities: Symmetric 5 x 5 power. Skin: No rashes, lesions or ulcers Psychiatry: Judgement and insight appear normal. Mood & affect appropriate.     Data Reviewed: I have personally reviewed following labs and imaging studies  CBC: Recent Labs  Lab 02/11/22 0952 02/12/22 0358 02/13/22 0358 02/14/22 0245 02/15/22 0240  WBC 13.3* 15.1* 19.3* 14.0* 9.8  NEUTROABS 9.1*  --   --   --  7.2  HGB 18.2* 17.0 15.3 13.5 12.8*  HCT 50.4 45.8 41.8 37.8* 36.1*  MCV 86.7 87.6 88.9 91.7 92.1  PLT 329 237 264 246 270   Basic Metabolic Panel: Recent Labs  Lab 02/11/22 0952 02/11/22 1218 02/11/22 1851 02/12/22 0358 02/12/22 0939 02/13/22 0358 02/13/22 0856 02/13/22 2321 02/14/22 0245 02/14/22 0640 02/14/22 1053 02/15/22 0240  NA 115* 117*   < > 121*   < > 124*   < > 128* 125* 127* 128* 130*  K 2.8* 3.0*  --  2.9*  --  3.2*  --   --  3.6  --   --  3.5  CL 70* 77*  --  84*  --  90*  --   --  98  --   --  104  CO2 26 25  --  25  --  28  --   --  23  --   --  23  GLUCOSE 127* 104*  --  101*  --  128*  --   --  92  --   --  111*  BUN 92* 87*  --  57*  --  33*  --   --  17  --   --  7  CREATININE 2.54* 2.07*  --  1.51*  --  1.13  --   --  1.02  --   --  0.76  CALCIUM 9.0 8.1*  --  8.3*  --  8.0*  --   --  7.5*  --   --  7.3*  MG 2.8*  --   --  2.4  --   --   --   --   --   --   --   --    < > = values in this interval not displayed.   GFR: Estimated Creatinine Clearance: 118.9 mL/min (by C-G formula based on SCr of 0.76 mg/dL). Liver Function Tests: Recent Labs  Lab 02/12/22 0358  AST 19  ALT 16  ALKPHOS 52  BILITOT 1.8*  PROT 6.1*  ALBUMIN 3.3*   No results for input(s): "LIPASE", "AMYLASE" in the last 168 hours. No results for input(s): "AMMONIA" in the last 168 hours. Coagulation Profile: No results for input(s): "INR", "PROTIME" in the last 168  hours. Cardiac Enzymes: Recent Labs  Lab 02/11/22 0952  CKTOTAL 484*   BNP (last 3 results) No results for input(s): "PROBNP" in the last 8760 hours. HbA1C: No results for input(s): "HGBA1C" in the last 72 hours. CBG: No results for input(s): "GLUCAP" in the last 168 hours. Lipid Profile: No results for input(s): "CHOL", "HDL", "LDLCALC", "TRIG", "CHOLHDL", "LDLDIRECT" in the last 72 hours. Thyroid Function Tests: No results for input(s): "TSH", "T4TOTAL", "FREET4", "T3FREE", "THYROIDAB" in the last 72 hours.  Anemia Panel: No  results for input(s): "VITAMINB12", "FOLATE", "FERRITIN", "TIBC", "IRON", "RETICCTPCT" in the last 72 hours. Sepsis Labs: Recent Labs  Lab 02/11/22 8185 02/11/22 1117  LATICACIDVEN 2.2* 1.5    Recent Results (from the past 240 hour(s))  Resp Panel by RT-PCR (Flu A&B, Covid) Anterior Nasal Swab     Status: None   Collection Time: 02/11/22  9:52 AM   Specimen: Anterior Nasal Swab  Result Value Ref Range Status   SARS Coronavirus 2 by RT PCR NEGATIVE NEGATIVE Final    Comment: (NOTE) SARS-CoV-2 target nucleic acids are NOT DETECTED.  The SARS-CoV-2 RNA is generally detectable in upper respiratory specimens during the acute phase of infection. The lowest concentration of SARS-CoV-2 viral copies this assay can detect is 138 copies/mL. A negative result does not preclude SARS-Cov-2 infection and should not be used as the sole basis for treatment or other patient management decisions. A negative result may occur with  improper specimen collection/handling, submission of specimen other than nasopharyngeal swab, presence of viral mutation(s) within the areas targeted by this assay, and inadequate number of viral copies(<138 copies/mL). A negative result must be combined with clinical observations, patient history, and epidemiological information. The expected result is Negative.  Fact Sheet for Patients:   BloggerCourse.com  Fact Sheet for Healthcare Providers:  SeriousBroker.it  This test is no t yet approved or cleared by the [place] FDA and  has been authorized for detection and/or diagnosis of SARS-CoV-2 by FDA under an Emergency Use Authorization (EUA). This EUA will remain  in effect (meaning this test can be used) for the duration of the COVID-19 declaration under Section 564(b)(1) of the Act, 21 U.S.C.section 360bbb-3(b)(1), unless the authorization is terminated  or revoked sooner.       Influenza A by PCR NEGATIVE NEGATIVE Final   Influenza B by PCR NEGATIVE NEGATIVE Final    Comment: (NOTE) The Xpert Xpress SARS-CoV-2/FLU/RSV plus assay is intended as an aid in the diagnosis of influenza from Nasopharyngeal swab specimens and should not be used as a sole basis for treatment. Nasal washings and aspirates are unacceptable for Xpert Xpress SARS-CoV-2/FLU/RSV testing.  Fact Sheet for Patients: BloggerCourse.com  Fact Sheet for Healthcare Providers: SeriousBroker.it  This test is not yet approved or cleared by the [place] FDA and has been authorized for detection and/or diagnosis of SARS-CoV-2 by FDA under an Emergency Use Authorization (EUA). This EUA will remain in effect (meaning this test can be used) for the duration of the COVID-19 declaration under Section 564(b)(1) of the Act, 21 U.S.C. section 360bbb-3(b)(1), unless the authorization is terminated or revoked.  Performed at [facility], [address] [place], [place] [address]   MRSA Next Gen by PCR, Nasal     Status: None   Collection Time: 02/11/22  6:30 PM   Specimen: Nasal Mucosa; Nasal Swab  Result Value Ref Range Status   MRSA by PCR Next Gen NOT DETECTED NOT DETECTED Final    Comment: (NOTE) The GeneXpert MRSA Assay (FDA approved for NASAL specimens only), is one component of a  comprehensive MRSA colonization surveillance program. It is not intended to diagnose MRSA infection nor to guide or monitor treatment for MRSA infections. Test performance is not FDA approved in patients less than 50 years old. Performed at [facility], [address] [place], [place] [address]          Radiology Studies: No results found.      Scheduled Meds:  atorvastatin  20 mg Oral Daily  Chlorhexidine Gluconate Cloth  6 each Topical Daily   heparin  5,000 Units Subcutaneous Q8H   potassium chloride  40 mEq Oral Daily   Continuous Infusions:  sodium chloride 100 mL/hr at 02/15/22 1054   sodium chloride Stopped (02/12/22 1801)     LOS: 4 days    Time spent:    Erick Blinks, MD [facility]   If 7PM-7AM, please contact night-coverage [email or web address]  02/15/2022, 7:03 PM

## 2022-02-16 DIAGNOSIS — N179 Acute kidney failure, unspecified: Secondary | ICD-10-CM | POA: Diagnosis not present

## 2022-02-16 DIAGNOSIS — E860 Dehydration: Secondary | ICD-10-CM | POA: Diagnosis not present

## 2022-02-16 DIAGNOSIS — E871 Hypo-osmolality and hyponatremia: Secondary | ICD-10-CM | POA: Diagnosis not present

## 2022-02-16 DIAGNOSIS — E782 Mixed hyperlipidemia: Secondary | ICD-10-CM | POA: Diagnosis not present

## 2022-02-16 LAB — SURGICAL PATHOLOGY

## 2022-02-16 LAB — BASIC METABOLIC PANEL
Anion gap: 4 — ABNORMAL LOW (ref 5–15)
BUN: 5 mg/dL — ABNORMAL LOW (ref 6–20)
CO2: 21 mmol/L — ABNORMAL LOW (ref 22–32)
Calcium: 7.7 mg/dL — ABNORMAL LOW (ref 8.9–10.3)
Chloride: 109 mmol/L (ref 98–111)
Creatinine, Ser: 0.61 mg/dL (ref 0.61–1.24)
GFR, Estimated: >60 mL/min (ref >60)
Glucose, Bld: 99 mg/dL (ref 70–99)
Potassium: 3.5 mmol/L (ref 3.5–5.1)
Sodium: 134 mmol/L — ABNORMAL LOW (ref 135–145)

## 2022-02-16 NOTE — Progress Notes (Signed)
PROGRESS NOTE    Logan Garrett  EAV:[id number] DOB: [birth year] DOA: 02/11/2022 PCP: Pcp, No    Brief Narrative:  51 year old male with a history of hypertension, hyperlipidemia who is currently an inmate at a correctional facility.  Was brought to the emergency room after an unwitnessed fall.  Reported that he was dizzy.  Noted to have bruising around his right orbit.  Labs showed acute kidney injury, hyponatremia.  He was also hypotensive on arrival.  Admitted for IV fluids.  Had abdominal pain and vomiting.  Abdominal x-ray showing concern for possible SBO.  General surgery consulted.  He was found to have umbilical hernia and concern for incarcerated bowel.  He underwent operative management.  Postop course has been unremarkable.  Hyponatremia improved with IV fluids. Plan to discontinue IV fluids and if labs stable in AM, likely discharge tomorrow   Assessment & Plan:   Principal Problem:   Hyponatremia Active Problems:   AKI (acute kidney injury) (HCC)   Syncope   Dehydration   Hyperlipidemia   Periorbital hematoma   Fall at home, initial encounter   SBO (small bowel obstruction) (HCC)   Hypokalemia   Incarcerated umbilical hernia   Syncope Fall Periorbital hematoma -Likely related to hypovolemia/hypotension -Continue IV hydration -Unclear whether patient truly had a seizure, sounds more like post syncopal convulsion -Overall sodium level is improving -EEG did not show any epileptiform discharges -CT head without any intracranial abnormality -no recurrence of symptoms   Hyponatremia -Related to hypovolemia, decrease p.o. intake -Sodium 115 on admission -He was started on saline with improvement of sodium -Nephrology following -Currently, sodium has improved to 134 -discontinue IV fluids today -if labs stable tomorrow, can likely discharge tomorrow  Hypokalemia -Replace -Magnesium normal  Hypotension with hypovolemic shock -This is related to hypovolemia in  the setting of renal failure and dehydration -Briefly required vasopressors, now weaned off   Acute kidney injury -Due to hypovolemia in the setting of ARB -Admission creatinine 2.54 -Current creatinine 0.61, improved with IV fluids -Renal ultrasound negative for hydronephrosis  Small bowel obstruction and incarcerated umbilical hernia -Seen by general surgery and underwent operative management 6/10 -Continue postop care per general surgery -Appears to be tolerating solid food -He is having bowel movements -No heavy lifting > 10 lbs, excessive bending, pushing, pulling, or squatting for 6-8 weeks after surgery.    Hyperlipidemia -Continue Lipitor   DVT prophylaxis: heparin injection 5,000 Units Start: 02/11/22 2200  Code Status: Full code Family Communication: Discussed with patient Disposition Plan: Status is: Inpatient Remains inpatient appropriate because: Continued management of hyponatremia     Consultants:  Nephrology General surgery  Procedures:  Primary umbilical hernia repair, reduction of small bowel  Antimicrobials:      Subjective: Tolerating diet. Pain is controlled. Having bowel movement  Objective: Vitals:   02/15/22 1848 02/15/22 2033 02/16/22 0453 02/16/22 1432  BP: 112/75 (!) 135/95 123/84 119/88  Pulse: 80 73 75 80  Resp: 20 16 19 20   Temp: 98.7 F (37.1 C) 98.6 F (37 C) 97.6 F (36.4 C) 99.1 F (37.3 C)  TempSrc: Oral Oral Oral   SpO2: 98% 98% 96% 98%  Weight:      Height:       No intake or output data in the 24 hours ending 02/16/22 2002  Filed Weights   02/11/22 0934 02/12/22 0500 02/14/22 0558  Weight: 81.6 kg 79.8 kg 77.8 kg    Examination:  General exam: Appears calm and comfortable  HEENT: bruising around right  eye Respiratory system: Clear to auscultation. Respiratory effort normal. Cardiovascular system: S1 & S2 heard, RRR. No JVD, murmurs, rubs, gallops or clicks. No pedal edema. Gastrointestinal system: Abdomen  is soft, nontender Central nervous system: Alert and oriented. No focal neurological deficits. Extremities: Symmetric 5 x 5 power. Skin: No rashes, lesions or ulcers Psychiatry: Judgement and insight appear normal. Mood & affect appropriate.     Data Reviewed: I have personally reviewed following labs and imaging studies  CBC: Recent Labs  Lab 02/11/22 0952 02/12/22 0358 02/13/22 0358 02/14/22 0245 02/15/22 0240  WBC 13.3* 15.1* 19.3* 14.0* 9.8  NEUTROABS 9.1*  --   --   --  7.2  HGB 18.2* 17.0 15.3 13.5 12.8*  HCT 50.4 45.8 41.8 37.8* 36.1*  MCV 86.7 87.6 88.9 91.7 92.1  PLT 329 237 264 246 270   Basic Metabolic Panel: Recent Labs  Lab 02/11/22 0952 02/11/22 1218 02/12/22 0358 02/12/22 0939 02/13/22 0358 02/13/22 0856 02/14/22 0245 02/14/22 0640 02/14/22 1053 02/15/22 0240 02/16/22 0514  NA 115*   < > 121*   < > 124*   < > 125* 127* 128* 130* 134*  K 2.8*   < > 2.9*  --  3.2*  --  3.6  --   --  3.5 3.5  CL 70*   < > 84*  --  90*  --  98  --   --  104 109  CO2 26   < > 25  --  28  --  23  --   --  23 21*  GLUCOSE 127*   < > 101*  --  128*  --  92  --   --  111* 99  BUN 92*   < > 57*  --  33*  --  17  --   --  7 5*  CREATININE 2.54*   < > 1.51*  --  1.13  --  1.02  --   --  0.76 0.61  CALCIUM 9.0   < > 8.3*  --  8.0*  --  7.5*  --   --  7.3* 7.7*  MG 2.8*  --  2.4  --   --   --   --   --   --   --   --    < > = values in this interval not displayed.   GFR: Estimated Creatinine Clearance: 118.9 mL/min (by C-G formula based on SCr of 0.61 mg/dL). Liver Function Tests: Recent Labs  Lab 02/12/22 0358  AST 19  ALT 16  ALKPHOS 52  BILITOT 1.8*  PROT 6.1*  ALBUMIN 3.3*   No results for input(s): "LIPASE", "AMYLASE" in the last 168 hours. No results for input(s): "AMMONIA" in the last 168 hours. Coagulation Profile: No results for input(s): "INR", "PROTIME" in the last 168 hours. Cardiac Enzymes: Recent Labs  Lab 02/11/22 0952  CKTOTAL 484*   BNP (last  3 results) No results for input(s): "PROBNP" in the last 8760 hours. HbA1C: No results for input(s): "HGBA1C" in the last 72 hours. CBG: No results for input(s): "GLUCAP" in the last 168 hours. Lipid Profile: No results for input(s): "CHOL", "HDL", "LDLCALC", "TRIG", "CHOLHDL", "LDLDIRECT" in the last 72 hours. Thyroid Function Tests: No results for input(s): "TSH", "T4TOTAL", "FREET4", "T3FREE", "THYROIDAB" in the last 72 hours.  Anemia Panel: No results for input(s): "VITAMINB12", "FOLATE", "FERRITIN", "TIBC", "IRON", "RETICCTPCT" in the last 72 hours. Sepsis Labs: Recent Labs  Lab 02/11/22 1610 02/11/22 1117  LATICACIDVEN 2.2* 1.5    Recent Results (from the past 240 hour(s))  Resp Panel by RT-PCR (Flu A&B, Covid) Anterior Nasal Swab     Status: None   Collection Time: 02/11/22  9:52 AM   Specimen: Anterior Nasal Swab  Result Value Ref Range Status   SARS Coronavirus 2 by RT PCR NEGATIVE NEGATIVE Final    Comment: (NOTE) SARS-CoV-2 target nucleic acids are NOT DETECTED.  The SARS-CoV-2 RNA is generally detectable in upper respiratory specimens during the acute phase of infection. The lowest concentration of SARS-CoV-2 viral copies this assay can detect is 138 copies/mL. A negative result does not preclude SARS-Cov-2 infection and should not be used as the sole basis for treatment or other patient management decisions. A negative result may occur with  improper specimen collection/handling, submission of specimen other than nasopharyngeal swab, presence of viral mutation(s) within the areas targeted by this assay, and inadequate number of viral copies(<138 copies/mL). A negative result must be combined with clinical observations, patient history, and epidemiological information. The expected result is Negative.  Fact Sheet for Patients:  BloggerCourse.com  Fact Sheet for Healthcare Providers:  SeriousBroker.it  This  test is no t yet approved or cleared by the [place] FDA and  has been authorized for detection and/or diagnosis of SARS-CoV-2 by FDA under an Emergency Use Authorization (EUA). This EUA will remain  in effect (meaning this test can be used) for the duration of the COVID-19 declaration under Section 564(b)(1) of the Act, 21 U.S.C.section 360bbb-3(b)(1), unless the authorization is terminated  or revoked sooner.       Influenza A by PCR NEGATIVE NEGATIVE Final   Influenza B by PCR NEGATIVE NEGATIVE Final    Comment: (NOTE) The Xpert Xpress SARS-CoV-2/FLU/RSV plus assay is intended as an aid in the diagnosis of influenza from Nasopharyngeal swab specimens and should not be used as a sole basis for treatment. Nasal washings and aspirates are unacceptable for Xpert Xpress SARS-CoV-2/FLU/RSV testing.  Fact Sheet for Patients: BloggerCourse.com  Fact Sheet for Healthcare Providers: SeriousBroker.it  This test is not yet approved or cleared by the [place] FDA and has been authorized for detection and/or diagnosis of SARS-CoV-2 by FDA under an Emergency Use Authorization (EUA). This EUA will remain in effect (meaning this test can be used) for the duration of the COVID-19 declaration under Section 564(b)(1) of the Act, 21 U.S.C. section 360bbb-3(b)(1), unless the authorization is terminated or revoked.  Performed at [facility], [address], [place], [place] [address]   MRSA Next Gen by PCR, Nasal     Status: None   Collection Time: 02/11/22  6:30 PM   Specimen: Nasal Mucosa; Nasal Swab  Result Value Ref Range Status   MRSA by PCR Next Gen NOT DETECTED NOT DETECTED Final    Comment: (NOTE) The GeneXpert MRSA Assay (FDA approved for NASAL specimens only), is one component of a comprehensive MRSA colonization surveillance program. It is not intended to diagnose MRSA infection nor to guide or monitor treatment for MRSA  infections. Test performance is not FDA approved in patients less than 61 years old. Performed at [facility], [address], [place], [place] [address]          Radiology Studies: No results found.      Scheduled Meds:  atorvastatin  20 mg Oral Daily   Chlorhexidine Gluconate Cloth  6 each Topical Daily   heparin  5,000 Units Subcutaneous Q8H   potassium chloride  40 mEq Oral  Daily   Continuous Infusions:  sodium chloride Stopped (02/12/22 1801)     LOS: 5 days    Time spent:    Erick Blinks, MD [facility]   If 7PM-7AM, please contact night-coverage [email or web address]  02/16/2022, 8:02 PM

## 2022-02-16 NOTE — Progress Notes (Signed)
Patient ID: Logan Garrett, male   DOB: Jan 07, 1971, 51 y.o.   MRN: [id number] S: Feels well, no new complaints. O:BP 123/84 (BP Location: Left Arm)   Pulse 75   Temp 97.6 F (36.4 C) (Oral)   Resp 19   Ht 6\' 4"  (1.93 m)   Wt 77.8 kg   SpO2 96%   BMI 20.88 kg/m   Intake/Output Summary (Last 24 hours) at 02/16/2022 0905 Last data filed at 02/15/2022 1259 Gross per 24 hour  Intake 480 ml  Output --  Net 480 ml   Intake/Output: I/O last 3 completed shifts: In: 820 [P.O.:820] Out: -   Intake/Output this shift:  No intake/output data recorded. Weight change:  Gen:NAD CVS: RRR Resp:CTA Abd: +BS, soft, mildly tender, no guarding or rebound Ext: no edema  Recent Labs  Lab 02/11/22 0952 02/11/22 1218 02/11/22 1851 02/12/22 0358 02/12/22 0939 02/13/22 0358 02/13/22 0856 02/13/22 1759 02/13/22 2321 02/14/22 0245 02/14/22 0640 02/14/22 1053 02/15/22 0240 02/16/22 0514  NA 115* 117*   < > 121*   < > 124*   < > 123* 128* 125* 127* 128* 130* 134*  K 2.8* 3.0*  --  2.9*  --  3.2*  --   --   --  3.6  --   --  3.5 3.5  CL 70* 77*  --  84*  --  90*  --   --   --  98  --   --  104 109  CO2 26 25  --  25  --  28  --   --   --  23  --   --  23 21*  GLUCOSE 127* 104*  --  101*  --  128*  --   --   --  92  --   --  111* 99  BUN 92* 87*  --  57*  --  33*  --   --   --  17  --   --  7 5*  CREATININE 2.54* 2.07*  --  1.51*  --  1.13  --   --   --  1.02  --   --  0.76 0.61  ALBUMIN  --   --   --  3.3*  --   --   --   --   --   --   --   --   --   --   CALCIUM 9.0 8.1*  --  8.3*  --  8.0*  --   --   --  7.5*  --   --  7.3* 7.7*  AST  --   --   --  19  --   --   --   --   --   --   --   --   --   --   ALT  --   --   --  16  --   --   --   --   --   --   --   --   --   --    < > = values in this interval not displayed.   Liver Function Tests: Recent Labs  Lab 02/12/22 0358  AST 19  ALT 16  ALKPHOS 52  BILITOT 1.8*  PROT 6.1*  ALBUMIN 3.3*   No results for input(s): "LIPASE",  "AMYLASE" in the last 168 hours. No results for input(s): "AMMONIA" in the last 168 hours. CBC: Recent Labs  Lab  02/11/22 TA:[id number] 02/12/22 0358 02/13/22 0358 02/14/22 0245 02/15/22 0240  WBC 13.3* 15.1* 19.3* 14.0* 9.8  NEUTROABS 9.1*  --   --   --  7.2  HGB 18.2* 17.0 15.3 13.5 12.8*  HCT 50.4 45.8 41.8 37.8* 36.1*  MCV 86.7 87.6 88.9 91.7 92.1  PLT 329 237 264 246 270   Cardiac Enzymes: Recent Labs  Lab 02/11/22 0952  CKTOTAL 484*   CBG: No results for input(s): "GLUCAP" in the last 168 hours.  Iron Studies: No results for input(s): "IRON", "TIBC", "TRANSFERRIN", "FERRITIN" in the last 72 hours. Studies/Results: No results found.  atorvastatin  20 mg Oral Daily   Chlorhexidine Gluconate Cloth  6 each Topical Daily   heparin  5,000 Units Subcutaneous Q8H   potassium chloride  40 mEq Oral Daily    BMET    Component Value Date/Time   NA 134 (L) 02/16/2022 0514   K 3.5 02/16/2022 0514   CL 109 02/16/2022 0514   CO2 21 (L) 02/16/2022 0514   GLUCOSE 99 02/16/2022 0514   BUN 5 (L) 02/16/2022 0514   CREATININE 0.61 02/16/2022 0514   CALCIUM 7.7 (L) 02/16/2022 0514   GFRNONAA >60 02/16/2022 0514   GFRAA >60 08/08/2018 1332   CBC    Component Value Date/Time   WBC 9.8 02/15/2022 0240   RBC 3.92 (L) 02/15/2022 0240   HGB 12.8 (L) 02/15/2022 0240   HCT 36.1 (L) 02/15/2022 0240   PLT 270 02/15/2022 0240   MCV 92.1 02/15/2022 0240   MCH 32.7 02/15/2022 0240   MCHC 35.5 02/15/2022 0240   RDW 11.8 02/15/2022 0240   LYMPHSABS 1.7 02/15/2022 0240   MONOABS 0.6 02/15/2022 0240   EOSABS 0.2 02/15/2022 0240   BASOSABS 0.0 02/15/2022 0240    Assessment/Plan:   AKI- secondary to ischemic ATN in setting of volume depletion and concomitant Ace inhibition.  Thankfully it has resolved with IVF's. Severe hypovolemic, hyponatremia - due to volume depletion.  Slowly improving with IVF's and is now up to 134.   Stop IVF's and follow sodium levels.  If sodium remains stable, he  is stable for discharge.  Resume IVF's as needed if sodium drops again. Nothing further to add.  Will sign off.  Please call with questions or concerns.   Hypokalemia - has been given multiple runs of IV KCl.  Now that he is taking po, would start 33mEq of KCl daily and follow.  This also help increase sodium levels.  SBO with incarcerated umbilical hernia - s/p surgery 02/13/22.  Advancing diet as tolerated. Hypovolemic shock - improved and is off of pressors.  Disposition - not quite ready for discharge as he is requiring IVF's to maintain serum sodium levels.  Once able to stop IVF's and maintain po intake with stable sodium levels, then he can be discharged.  Donetta Potts, MD [facility]

## 2022-02-16 NOTE — Progress Notes (Signed)
[facility]  Hernia incision looks good. No erythema or drainage.   BP 123/84 (BP Location: Left Arm)   Pulse 75   Temp 97.6 F (36.4 C) (Oral)   Resp 19   Ht 6\' 4"  (1.93 m)   Wt 77.8 kg   SpO2 96%   BMI 20.88 kg/m   Diet as tolerated No heavy lifting > 10 lbs, excessive bending, pushing, pulling, or squatting for 6-8 weeks after surgery.   Curlene Labrum, MD [facility] [place], [place] [address] [phone] (office)  Future Appointments  Date Time Provider [place]  03/11/2022  3:15 PM Virl Cagey, MD [facility] None

## 2022-02-16 NOTE — Plan of Care (Signed)

## 2022-02-17 ENCOUNTER — Encounter (HOSPITAL_COMMUNITY): Payer: Self-pay | Admitting: General Surgery

## 2022-02-17 DIAGNOSIS — E871 Hypo-osmolality and hyponatremia: Secondary | ICD-10-CM | POA: Diagnosis not present

## 2022-02-17 LAB — BASIC METABOLIC PANEL
Anion gap: 6 (ref 5–15)
BUN: 5 mg/dL — ABNORMAL LOW (ref 6–20)
CO2: 21 mmol/L — ABNORMAL LOW (ref 22–32)
Calcium: 8 mg/dL — ABNORMAL LOW (ref 8.9–10.3)
Chloride: 105 mmol/L (ref 98–111)
Creatinine, Ser: 0.67 mg/dL (ref 0.61–1.24)
GFR, Estimated: >60 mL/min (ref >60)
Glucose, Bld: 108 mg/dL — ABNORMAL HIGH (ref 70–99)
Potassium: 3.4 mmol/L — ABNORMAL LOW (ref 3.5–5.1)
Sodium: 132 mmol/L — ABNORMAL LOW (ref 135–145)

## 2022-02-17 MED ORDER — POTASSIUM CHLORIDE CRYS ER 20 MEQ PO TBCR: 20.0000 meq | EXTENDED_RELEASE_TABLET | Freq: Two times a day (BID) | ORAL | 0 refills | Status: AC

## 2022-02-17 NOTE — Discharge Summary (Signed)
Physician Discharge Summary  Armarion Greek GNF:[id number] DOB: [birth year] DOA: 02/11/2022  PCP: Pcp, No  Admit date: 02/11/2022  Discharge date: 02/17/2022  Admitted From:Prison  Disposition:  Prison  Recommendations for Outpatient Follow-up:  Follow up with PCP in 1-2 weeks Please obtain BMP in one week to recheck Na and K levels Continue potassium supplementation as prescribed below Follow-up with general surgery as scheduled outpatient for wound check Hold ACE inhibitor moving forward and monitor blood pressure levels No heavy lifting > 10 lbs, excessive bending, pushing, pulling, or squatting for 6-8 weeks after surgery.   Home Health: None  Equipment/Devices: None  Discharge Condition:Stable  CODE STATUS: Full  Diet recommendation: Heart Healthy  Brief/Interim Summary: 51 year old male with a history of hypertension, hyperlipidemia who is currently an inmate at a correctional facility.  Was brought to the emergency room after an unwitnessed fall.  Reported that he was dizzy.  Noted to have bruising around his right orbit.  Labs showed acute kidney injury, hyponatremia.  He was also hypotensive on arrival and briefly required pressors.  Admitted for IV fluids with slow improvement in sodium levels and nephrology was following.  He was noted to have ischemic ATN which is contributing to his hyponatremia and this has now stabilized after administration of IV normal saline.  He then had abdominal pain and vomiting with abdominal x-ray showing concern for possible SBO.  General surgery consulted.  He was found to have umbilical hernia and concern for incarcerated bowel.  He underwent operative management on 6/10.  Postop course has been unremarkable.  Hyponatremia has now stabilized off of IV fluids.  His blood pressures have also remained stable off of his ACE inhibitor and this will be discontinued moving forward as this will lessen his risk of developing ATN in case he should become  dehydrated once more.  Additionally he will remain on potassium supplementation per nephrology recommendations which will also aid in his sodium levels remaining stable.  Discharge Diagnoses:  Principal Problem:   Hyponatremia Active Problems:   AKI (acute kidney injury) (HCC)   Syncope   Dehydration   Hyperlipidemia   Periorbital hematoma   Fall at home, initial encounter   SBO (small bowel obstruction) (HCC)   Hypokalemia   Incarcerated umbilical hernia  Principal discharge diagnosis: Fall likely secondary to syncopal episode associated with periorbital hematoma.  Likely related to hypovolemia/hypotension.  Hyponatremia related to ischemic ATN.  Discharge Instructions  Discharge Instructions     Diet - low sodium heart healthy   Complete by: As directed    If the dressing is still on your incision site when you go home, remove it on the third day after your surgery date. Remove dressing if it begins to fall off, or if it is dirty or damaged before the third day.   Complete by: As directed    Increase activity slowly   Complete by: As directed       Allergies as of 02/17/2022   No Known Allergies      Medication List     STOP taking these medications    lisinopril 20 MG tablet Commonly known as: ZESTRIL       TAKE these medications    acetaminophen 325 MG tablet Commonly known as: TYLENOL Take 650 mg by mouth every 6 (six) hours as needed for mild pain, headache or fever.   atorvastatin 20 MG tablet Commonly known as: LIPITOR Take 20 mg by mouth daily.   potassium chloride SA 20 MEQ  tablet Commonly known as: KLOR-CON M Take 1 tablet (20 mEq total) by mouth 2 (two) times daily.               Discharge Care Instructions  (From admission, onward)           Start     Ordered   02/17/22 0000  If the dressing is still on your incision site when you go home, remove it on the third day after your surgery date. Remove dressing if it begins to fall  off, or if it is dirty or damaged before the third day.        02/17/22 2841            Follow-up Information     Lucretia Roers, MD Follow up on 03/11/2022.   Specialty: General Surgery Why: wound check, hernia check Contact information: [address] [facility] [address] [phone]                No Known Allergies  Consultations: Nephrology General surgery   Procedures/Studies: CT ABDOMEN PELVIS W CONTRAST  Result Date: 02/12/2022 CLINICAL DATA:  Concern for bowel obstruction. EXAM: CT ABDOMEN AND PELVIS WITH CONTRAST TECHNIQUE: Multidetector CT imaging of the abdomen and pelvis was performed using the standard protocol following bolus administration of intravenous contrast. RADIATION DOSE REDUCTION: This exam was performed according to the departmental dose-optimization program which includes automated exposure control, adjustment of the mA and/or kV according to patient size and/or use of iterative reconstruction technique. CONTRAST:  OMNIPAQUE IOHEXOL 300 MG/ML  SOLN COMPARISON:  Abdominal radiograph dated 02/11/2022 and CT of the chest abdomen pelvis dated 04/13/2012. FINDINGS: Lower chest: Large area of airspace opacity involving the visualized left lung base most concerning for pneumonia or aspiration. Clinical correlation and follow-up is recommended. There is coronary vascular calcification. No intra-abdominal free air or free fluid. Hepatobiliary: Small cysts in the left lobe of the liver measuring up to 15 mm. Additional subcentimeter hypodense lesion is too small to characterize. No intrahepatic biliary ductal dilatation. The gallbladder is unremarkable. Pancreas: Unremarkable. No pancreatic ductal dilatation or surrounding inflammatory changes. Spleen: Normal in size without focal abnormality. Adrenals/Urinary Tract: The adrenal glands are unremarkable. There is no hydronephrosis on either side. There is symmetric enhancement and excretion of  contrast by both kidneys. The visualized ureters and urinary bladder appear unremarkable. Stomach/Bowel: There is herniation of a loop of small bowel into the umbilicus. There is pinching of the herniated bowel at the neck of the hernia with associated small bowel obstruction. The small bowel loops proximal to the hernia are dilated measuring up to 5.5 cm in caliber. The distal small bowel are collapsed. The colon is unremarkable. The appendix is normal. Vascular/Lymphatic: The abdominal aorta and IVC are unremarkable. No portal venous gas. There is no adenopathy. Reproductive: The prostate and seminal vesicles are grossly unremarkable. No pelvic mass. Other: None Musculoskeletal: Degenerative changes of the spine. No acute osseous pathology. IMPRESSION: 1. Small-bowel obstruction secondary to umbilical hernia. Surgical consult is advised. 2. Left lung base pneumonia or aspiration.  Follow-up recommended. Electronically Signed   By: Elgie Collard M.D.   On: 02/12/2022 21:35   EEG adult  Result Date: 02/12/2022 Charlsie Quest, MD     02/12/2022  6:27 PM Patient Name: Patricia Perales MRN: [id number] Epilepsy Attending: Charlsie Quest Referring Physician/Provider: Lilyan Gilford, DO Date: 02/12/2022 Duration: 22.35 mins Patient history: 51yo M undergoing eeg to evaluate for seizure Level of alertness: Awake,  asleep AEDs during EEG study: None Technical aspects: This EEG study was done with scalp electrodes positioned according to the 10-20 International system of electrode placement. Electrical activity was acquired at a sampling rate of  and reviewed with a high frequency filter of  and a low frequency filter of . EEG data were recorded continuously and digitally stored. Description: The posterior dominant rhythm consists of 8-9 Hz activity of moderate voltage (25-35 uV) seen predominantly in posterior head regions, symmetric and reactive to eye opening and eye closing. Sleep was characterized  by vertex waves, sleep spindles (12 to 14 Hz), maximal frontocentral region. Hyperventilation and photic stimulation were not performed.   IMPRESSION: This study is within normal limits. No seizures or epileptiform discharges were seen throughout the recording. Charlsie Quest   DG Abd 1 View  Result Date: 02/11/2022 CLINICAL DATA:  Abdominal pain. EXAM: ABDOMEN - 1 VIEW COMPARISON:  None Available. FINDINGS: There are dilated air-filled small bowel loops in the upper abdomen measuring up to 5 cm. The stomach is also dilated and air-filled. Minimal colonic gas identified. There is a phlebolith in the left hemipelvis. No acute fractures are seen. There are degenerative changes of the lower lumbar spine. Visualized lung bases are clear. IMPRESSION: 1. Dilated proximal small bowel and stomach worrisome for small bowel obstruction. Electronically Signed   By: Darliss Cheney M.D.   On: 02/11/2022 23:21   US RENAL  Result Date: 02/11/2022 CLINICAL DATA:  Renal dysfunction EXAM: RENAL / URINARY TRACT ULTRASOUND COMPLETE COMPARISON:  CT abdomen done on 04/13/2012 FINDINGS: Right Kidney: Renal measurements: 11.9 x 7.5 x 6.8 cm = volume: 315.1 mL. There is no hydronephrosis. There is increased cortical echogenicity. Left Kidney: Renal measurements: 12.5 x 6.2 by 6.1 cm = volume: 249.1 mL. There is no hydronephrosis. There is increased cortical echogenicity. Technologist observed 6 mm hyperechoic focus in the upper pole of left kidney. This may suggest nonobstructing calculus or partial volume averaging of renal sinus fat. Bladder: Unremarkable. Other: None. IMPRESSION: There is no hydronephrosis. There is increased cortical echogenicity in the kidneys suggesting possible medical renal disease. 6 mm hyperechoic focus in the left kidney may suggest nonobstructing calculus or partial volume averaging artifact. Electronically Signed   By: Ernie Avena M.D.   On: 02/11/2022 13:53   CT Head Wo Contrast  Result  Date: 02/11/2022 CLINICAL DATA:  Confusion and possible seizure after unwitnessed fall last night. Bruising to the right eye. EXAM: CT HEAD WITHOUT CONTRAST CT MAXILLOFACIAL WITHOUT CONTRAST CT CERVICAL SPINE WITHOUT CONTRAST TECHNIQUE: Multidetector CT imaging of the head, cervical spine, and maxillofacial structures were performed using the standard protocol without intravenous contrast. Multiplanar CT image reconstructions of the cervical spine and maxillofacial structures were also generated. RADIATION DOSE REDUCTION: This exam was performed according to the departmental dose-optimization program which includes automated exposure control, adjustment of the mA and/or kV according to patient size and/or use of iterative reconstruction technique. COMPARISON:  CT maxillofacial dated July 31, 2018. FINDINGS: CT HEAD FINDINGS Brain: No evidence of acute infarction, hemorrhage, hydrocephalus, extra-axial collection or mass lesion/mass effect. Mild generalized cerebral atrophy. Vascular: No hyperdense vessel or unexpected calcification. Skull: Normal. Negative for fracture or focal lesion. Other: None. CT MAXILLOFACIAL FINDINGS Osseous: No acute fracture or mandibular dislocation. No destructive process. Orbits: Negative. No traumatic or inflammatory finding. Sinuses: Small retention cysts in both maxillary sinuses. Otherwise clear. Soft tissues: Small right periorbital hematoma. CT CERVICAL SPINE FINDINGS Alignment: Straightening of the normal cervical lordosis. No  traumatic malalignment. Skull base and vertebrae: No acute fracture. No primary bone lesion or focal pathologic process. Soft tissues and spinal canal: No prevertebral fluid or swelling. No visible canal hematoma. Disc levels: Mild-to-moderate multilevel disc height loss and facet uncovertebral hypertrophy throughout the cervical spine. Upper chest: Negative. Other: 9 mm hypodense nodule in the left thyroid lobe. Not clinically significant; no follow-up  imaging recommended (ref: J Am Coll Radiol. 2015 Feb;12(2): 143-50). IMPRESSION: 1. No acute intracranial abnormality. Small right periorbital hematoma. 2. No acute facial fracture. 3. No acute cervical spine fracture or traumatic listhesis. Electronically Signed   By: Obie DredgeWilliam T Derry M.D.   On: 02/11/2022 11:03   CT Maxillofacial Wo Contrast  Result Date: 02/11/2022 CLINICAL DATA:  Confusion and possible seizure after unwitnessed fall last night. Bruising to the right eye. EXAM: CT HEAD WITHOUT CONTRAST CT MAXILLOFACIAL WITHOUT CONTRAST CT CERVICAL SPINE WITHOUT CONTRAST TECHNIQUE: Multidetector CT imaging of the head, cervical spine, and maxillofacial structures were performed using the standard protocol without intravenous contrast. Multiplanar CT image reconstructions of the cervical spine and maxillofacial structures were also generated. RADIATION DOSE REDUCTION: This exam was performed according to the departmental dose-optimization program which includes automated exposure control, adjustment of the mA and/or kV according to patient size and/or use of iterative reconstruction technique. COMPARISON:  CT maxillofacial dated July 31, 2018. FINDINGS: CT HEAD FINDINGS Brain: No evidence of acute infarction, hemorrhage, hydrocephalus, extra-axial collection or mass lesion/mass effect. Mild generalized cerebral atrophy. Vascular: No hyperdense vessel or unexpected calcification. Skull: Normal. Negative for fracture or focal lesion. Other: None. CT MAXILLOFACIAL FINDINGS Osseous: No acute fracture or mandibular dislocation. No destructive process. Orbits: Negative. No traumatic or inflammatory finding. Sinuses: Small retention cysts in both maxillary sinuses. Otherwise clear. Soft tissues: Small right periorbital hematoma. CT CERVICAL SPINE FINDINGS Alignment: Straightening of the normal cervical lordosis. No traumatic malalignment. Skull base and vertebrae: No acute fracture. No primary bone lesion or focal  pathologic process. Soft tissues and spinal canal: No prevertebral fluid or swelling. No visible canal hematoma. Disc levels: Mild-to-moderate multilevel disc height loss and facet uncovertebral hypertrophy throughout the cervical spine. Upper chest: Negative. Other: 9 mm hypodense nodule in the left thyroid lobe. Not clinically significant; no follow-up imaging recommended (ref: J Am Coll Radiol. 2015 Feb;12(2): 143-50). IMPRESSION: 1. No acute intracranial abnormality. Small right periorbital hematoma. 2. No acute facial fracture. 3. No acute cervical spine fracture or traumatic listhesis. Electronically Signed   By: Obie DredgeWilliam T Derry M.D.   On: 02/11/2022 11:03   CT Cervical Spine Wo Contrast  Result Date: 02/11/2022 CLINICAL DATA:  Confusion and possible seizure after unwitnessed fall last night. Bruising to the right eye. EXAM: CT HEAD WITHOUT CONTRAST CT MAXILLOFACIAL WITHOUT CONTRAST CT CERVICAL SPINE WITHOUT CONTRAST TECHNIQUE: Multidetector CT imaging of the head, cervical spine, and maxillofacial structures were performed using the standard protocol without intravenous contrast. Multiplanar CT image reconstructions of the cervical spine and maxillofacial structures were also generated. RADIATION DOSE REDUCTION: This exam was performed according to the departmental dose-optimization program which includes automated exposure control, adjustment of the mA and/or kV according to patient size and/or use of iterative reconstruction technique. COMPARISON:  CT maxillofacial dated July 31, 2018. FINDINGS: CT HEAD FINDINGS Brain: No evidence of acute infarction, hemorrhage, hydrocephalus, extra-axial collection or mass lesion/mass effect. Mild generalized cerebral atrophy. Vascular: No hyperdense vessel or unexpected calcification. Skull: Normal. Negative for fracture or focal lesion. Other: None. CT MAXILLOFACIAL FINDINGS Osseous: No acute fracture or mandibular dislocation. No  destructive process. Orbits:  Negative. No traumatic or inflammatory finding. Sinuses: Small retention cysts in both maxillary sinuses. Otherwise clear. Soft tissues: Small right periorbital hematoma. CT CERVICAL SPINE FINDINGS Alignment: Straightening of the normal cervical lordosis. No traumatic malalignment. Skull base and vertebrae: No acute fracture. No primary bone lesion or focal pathologic process. Soft tissues and spinal canal: No prevertebral fluid or swelling. No visible canal hematoma. Disc levels: Mild-to-moderate multilevel disc height loss and facet uncovertebral hypertrophy throughout the cervical spine. Upper chest: Negative. Other: 9 mm hypodense nodule in the left thyroid lobe. Not clinically significant; no follow-up imaging recommended (ref: J Am Coll Radiol. 2015 Feb;12(2): 143-50). IMPRESSION: 1. No acute intracranial abnormality. Small right periorbital hematoma. 2. No acute facial fracture. 3. No acute cervical spine fracture or traumatic listhesis. Electronically Signed   By: Obie Dredge M.D.   On: 02/11/2022 11:03   DG Chest 2 View  Result Date: 02/11/2022 CLINICAL DATA:  Syncope, fall, weakness EXAM: CHEST - 2 VIEW COMPARISON:  None Available. FINDINGS: The heart size and mediastinal contours are within normal limits. Both lungs are clear. The visualized skeletal structures are unremarkable. Trachea midline. No acute osseous finding. Skin fold noted over the left apex. Minor basilar atelectasis versus scarring. IMPRESSION: No active cardiopulmonary disease. Electronically Signed   By: Judie Petit.  Shick M.D.   On: 02/11/2022 10:56     Discharge Exam: Vitals:   02/16/22 2024 02/17/22 0414  BP: 121/89 (!) 124/92  Pulse: 80 74  Resp: 18 19  Temp: 98.2 F (36.8 C) (!) 97.5 F (36.4 C)  SpO2: 98% 97%   Vitals:   02/16/22 0453 02/16/22 1432 02/16/22 2024 02/17/22 0414  BP: 123/84 119/88 121/89 (!) 124/92  Pulse: 75 80 80 74  Resp: 19 20 18 19   Temp: 97.6 F (36.4 C) 99.1 F (37.3 C) 98.2 F (36.8 C) (!)  97.5 F (36.4 C)  TempSrc: Oral  Oral Oral  SpO2: 96% 98% 98% 97%  Weight:      Height:        General: Pt is alert, awake, not in acute distress Cardiovascular: RRR, S1/S2 +, no rubs, no gallops Respiratory: CTA bilaterally, no wheezing, no rhonchi Abdominal: Soft, NT, ND, bowel sounds +, abdominal incisions clean dry and intact. Extremities: no edema, no cyanosis Right periorbital edema noted    The results of significant diagnostics from this hospitalization (including imaging, microbiology, ancillary and laboratory) are listed below for reference.     Microbiology: Recent Results (from the past 240 hour(s))  Resp Panel by RT-PCR (Flu A&B, Covid) Anterior Nasal Swab     Status: None   Collection Time: 02/11/22  9:52 AM   Specimen: Anterior Nasal Swab  Result Value Ref Range Status   SARS Coronavirus 2 by RT PCR NEGATIVE NEGATIVE Final    Comment: (NOTE) SARS-CoV-2 target nucleic acids are NOT DETECTED.  The SARS-CoV-2 RNA is generally detectable in upper respiratory specimens during the acute phase of infection. The lowest concentration of SARS-CoV-2 viral copies this assay can detect is 138 copies/mL. A negative result does not preclude SARS-Cov-2 infection and should not be used as the sole basis for treatment or other patient management decisions. A negative result may occur with  improper specimen collection/handling, submission of specimen other than nasopharyngeal swab, presence of viral mutation(s) within the areas targeted by this assay, and inadequate number of viral copies(<138 copies/mL). A negative result must be combined with clinical observations, patient history, and epidemiological information. The expected result  is Negative.  Fact Sheet for Patients:  BloggerCourse.com  Fact Sheet for Healthcare Providers:  SeriousBroker.it  This test is no t yet approved or cleared by the [place] FDA and   has been authorized for detection and/or diagnosis of SARS-CoV-2 by FDA under an Emergency Use Authorization (EUA). This EUA will remain  in effect (meaning this test can be used) for the duration of the COVID-19 declaration under Section 564(b)(1) of the Act, 21 U.S.C.section 360bbb-3(b)(1), unless the authorization is terminated  or revoked sooner.       Influenza A by PCR NEGATIVE NEGATIVE Final   Influenza B by PCR NEGATIVE NEGATIVE Final    Comment: (NOTE) The Xpert Xpress SARS-CoV-2/FLU/RSV plus assay is intended as an aid in the diagnosis of influenza from Nasopharyngeal swab specimens and should not be used as a sole basis for treatment. Nasal washings and aspirates are unacceptable for Xpert Xpress SARS-CoV-2/FLU/RSV testing.  Fact Sheet for Patients: BloggerCourse.com  Fact Sheet for Healthcare Providers: SeriousBroker.it  This test is not yet approved or cleared by the [place] FDA and has been authorized for detection and/or diagnosis of SARS-CoV-2 by FDA under an Emergency Use Authorization (EUA). This EUA will remain in effect (meaning this test can be used) for the duration of the COVID-19 declaration under Section 564(b)(1) of the Act, 21 U.S.C. section 360bbb-3(b)(1), unless the authorization is terminated or revoked.  Performed at [facility], [address], [place], [place] [address]   MRSA Next Gen by PCR, Nasal     Status: None   Collection Time: 02/11/22  6:30 PM   Specimen: Nasal Mucosa; Nasal Swab  Result Value Ref Range Status   MRSA by PCR Next Gen NOT DETECTED NOT DETECTED Final    Comment: (NOTE) The GeneXpert MRSA Assay (FDA approved for NASAL specimens only), is one component of a comprehensive MRSA colonization surveillance program. It is not intended to diagnose MRSA infection nor to guide or monitor treatment for MRSA infections. Test performance is not FDA approved in patients less  than 39 years old. Performed at [facility], [address], [place], [place] [address]      Labs: BNP (last 3 results) No results for input(s): "BNP" in the last 8760 hours. Basic Metabolic Panel: Recent Labs  Lab 02/11/22 0952 02/11/22 1218 02/12/22 0358 02/12/22 0939 02/13/22 0358 02/13/22 0856 02/14/22 0245 02/14/22 0640 02/14/22 1053 02/15/22 0240 02/16/22 0514 02/17/22 0452  NA 115*   < > 121*   < > 124*   < > 125* 127* 128* 130* 134* 132*  K 2.8*   < > 2.9*  --  3.2*  --  3.6  --   --  3.5 3.5 3.4*  CL 70*   < > 84*  --  90*  --  98  --   --  104 109 105  CO2 26   < > 25  --  28  --  23  --   --  23 21* 21*  GLUCOSE 127*   < > 101*  --  128*  --  92  --   --  111* 99 108*  BUN 92*   < > 57*  --  33*  --  17  --   --  7 5* 5*  CREATININE 2.54*   < > 1.51*  --  1.13  --  1.02  --   --  0.76 0.61 0.67  CALCIUM 9.0   < > 8.3*  --  8.0*  --  7.5*  --   --  7.3* 7.7* 8.0*  MG 2.8*  --  2.4  --   --   --   --   --   --   --   --   --    < > = values in this interval not displayed.   Liver Function Tests: Recent Labs  Lab 02/12/22 0358  AST 19  ALT 16  ALKPHOS 52  BILITOT 1.8*  PROT 6.1*  ALBUMIN 3.3*   No results for input(s): "LIPASE", "AMYLASE" in the last 168 hours. No results for input(s): "AMMONIA" in the last 168 hours. CBC: Recent Labs  Lab 02/11/22 0952 02/12/22 0358 02/13/22 0358 02/14/22 0245 02/15/22 0240  WBC 13.3* 15.1* 19.3* 14.0* 9.8  NEUTROABS 9.1*  --   --   --  7.2  HGB 18.2* 17.0 15.3 13.5 12.8*  HCT 50.4 45.8 41.8 37.8* 36.1*  MCV 86.7 87.6 88.9 91.7 92.1  PLT 329 237 264 246 270   Cardiac Enzymes: Recent Labs  Lab 02/11/22 0952  CKTOTAL 484*   BNP: Invalid input(s): "POCBNP" CBG: No results for input(s): "GLUCAP" in the last 168 hours. D-Dimer No results for input(s): "DDIMER" in the last 72 hours. Hgb A1c No results for input(s): "HGBA1C" in the last 72 hours. Lipid Profile No results for input(s): "CHOL", "HDL",  "LDLCALC", "TRIG", "CHOLHDL", "LDLDIRECT" in the last 72 hours. Thyroid function studies No results for input(s): "TSH", "T4TOTAL", "T3FREE", "THYROIDAB" in the last 72 hours.  Invalid input(s): "FREET3" Anemia work up No results for input(s): "VITAMINB12", "FOLATE", "FERRITIN", "TIBC", "IRON", "RETICCTPCT" in the last 72 hours. Urinalysis    Component Value Date/Time   COLORURINE YELLOW 02/11/2022 1311   APPEARANCEUR CLEAR 02/11/2022 1311   LABSPEC 1.013 02/11/2022 1311   PHURINE 5.0 02/11/2022 1311   GLUCOSEU NEGATIVE 02/11/2022 1311   HGBUR MODERATE (A) 02/11/2022 1311   BILIRUBINUR NEGATIVE 02/11/2022 1311   KETONESUR 20 (A) 02/11/2022 1311   PROTEINUR NEGATIVE 02/11/2022 1311   UROBILINOGEN 0.2 04/13/2012 1057   NITRITE NEGATIVE 02/11/2022 1311   LEUKOCYTESUR NEGATIVE 02/11/2022 1311   Sepsis Labs Recent Labs  Lab 02/12/22 0358 02/13/22 0358 02/14/22 0245 02/15/22 0240  WBC 15.1* 19.3* 14.0* 9.8   Microbiology Recent Results (from the past 240 hour(s))  Resp Panel by RT-PCR (Flu A&B, Covid) Anterior Nasal Swab     Status: None   Collection Time: 02/11/22  9:52 AM   Specimen: Anterior Nasal Swab  Result Value Ref Range Status   SARS Coronavirus 2 by RT PCR NEGATIVE NEGATIVE Final    Comment: (NOTE) SARS-CoV-2 target nucleic acids are NOT DETECTED.  The SARS-CoV-2 RNA is generally detectable in upper respiratory specimens during the acute phase of infection. The lowest concentration of SARS-CoV-2 viral copies this assay can detect is 138 copies/mL. A negative result does not preclude SARS-Cov-2 infection and should not be used as the sole basis for treatment or other patient management decisions. A negative result may occur with  improper specimen collection/handling, submission of specimen other than nasopharyngeal swab, presence of viral mutation(s) within the areas targeted by this assay, and inadequate number of viral copies(<138 copies/mL). A negative result  must be combined with clinical observations, patient history, and epidemiological information. The expected result is Negative.  Fact Sheet for Patients:  BloggerCourse.com  Fact Sheet for Healthcare Providers:  SeriousBroker.it  This test is no t yet approved or cleared by the [place] FDA and  has been  authorized for detection and/or diagnosis of SARS-CoV-2 by FDA under an Emergency Use Authorization (EUA). This EUA will remain  in effect (meaning this test can be used) for the duration of the COVID-19 declaration under Section 564(b)(1) of the Act, 21 U.S.C.section 360bbb-3(b)(1), unless the authorization is terminated  or revoked sooner.       Influenza A by PCR NEGATIVE NEGATIVE Final   Influenza B by PCR NEGATIVE NEGATIVE Final    Comment: (NOTE) The Xpert Xpress SARS-CoV-2/FLU/RSV plus assay is intended as an aid in the diagnosis of influenza from Nasopharyngeal swab specimens and should not be used as a sole basis for treatment. Nasal washings and aspirates are unacceptable for Xpert Xpress SARS-CoV-2/FLU/RSV testing.  Fact Sheet for Patients: BloggerCourse.com  Fact Sheet for Healthcare Providers: SeriousBroker.it  This test is not yet approved or cleared by the [place] FDA and has been authorized for detection and/or diagnosis of SARS-CoV-2 by FDA under an Emergency Use Authorization (EUA). This EUA will remain in effect (meaning this test can be used) for the duration of the COVID-19 declaration under Section 564(b)(1) of the Act, 21 U.S.C. section 360bbb-3(b)(1), unless the authorization is terminated or revoked.  Performed at [facility], [address] [place], [place] [address]   MRSA Next Gen by PCR, Nasal     Status: None   Collection Time: 02/11/22  6:30 PM   Specimen: Nasal Mucosa; Nasal Swab  Result Value Ref Range Status   MRSA by PCR Next  Gen NOT DETECTED NOT DETECTED Final    Comment: (NOTE) The GeneXpert MRSA Assay (FDA approved for NASAL specimens only), is one component of a comprehensive MRSA colonization surveillance program. It is not intended to diagnose MRSA infection nor to guide or monitor treatment for MRSA infections. Test performance is not FDA approved in patients less than 72 years old. Performed at [facility], [address], [place], [place] [address]      Time coordinating discharge: 35 minutes  SIGNED:   Erick Blinks, DO [facility] 02/17/2022, 9:28 AM  If 7PM-7AM, please contact night-coverage [email or web address]

## 2022-02-17 NOTE — Progress Notes (Signed)
Ng Discharge Note  Admit Date:  02/11/2022 Discharge date: 02/17/2022   Logan Garrett to be D/C'd to [facility] per MD order.  AVS completed. Patient/caregiver able to verbalize understanding.  Discharge Medication: Allergies as of 02/17/2022   No Known Allergies      Medication List     STOP taking these medications    lisinopril 20 MG tablet Commonly known as: ZESTRIL       TAKE these medications    acetaminophen 325 MG tablet Commonly known as: TYLENOL Take 650 mg by mouth every 6 (six) hours as needed for mild pain, headache or fever.   atorvastatin 20 MG tablet Commonly known as: LIPITOR Take 20 mg by mouth daily.   potassium chloride SA 20 MEQ tablet Commonly known as: KLOR-CON M Take 1 tablet (20 mEq total) by mouth 2 (two) times daily.               Discharge Care Instructions  (From admission, onward)           Start     Ordered   02/17/22 0000  If the dressing is still on your incision site when you go home, remove it on the third day after your surgery date. Remove dressing if it begins to fall off, or if it is dirty or damaged before the third day.        02/17/22 0928            Discharge Assessment: Vitals:   02/16/22 2024 02/17/22 0414  BP: 121/89 (!) 124/92  Pulse: 80 74  Resp: 18 19  Temp: 98.2 F (36.8 C) (!) 97.5 F (36.4 C)  SpO2: 98% 97%   Skin clean, dry and intact without evidence of skin break down, no evidence of skin tears noted. IV catheter discontinued intact. Site without signs and symptoms of complications - no redness or edema noted at insertion site, patient denies c/o pain - only slight tenderness at site.  Dressing with slight pressure applied.  D/c Instructions-Education: Discharge instructions given to patient/family with verbalized understanding. D/c education completed with patient/family including follow up instructions, medication list, d/c activities limitations if indicated, with other  d/c instructions as indicated by MD - patient able to verbalize understanding, all questions fully answered. Patient instructed to return to ED, call 911, or call MD for any changes in condition.  Patient escorted via [facility], and discharged to [name] with officer.  Cristal Ford, LPN 3/71/6967 89:38 AM

## 2022-03-11 ENCOUNTER — Encounter: Payer: Self-pay | Admitting: General Surgery

## 2022-03-18 ENCOUNTER — Ambulatory Visit (INDEPENDENT_AMBULATORY_CARE_PROVIDER_SITE_OTHER): Payer: Self-pay | Admitting: General Surgery

## 2022-03-18 ENCOUNTER — Encounter: Payer: Self-pay | Admitting: General Surgery

## 2022-03-18 VITALS — BP 160/73 | HR 70 | Temp 98.2°F | Resp 12 | Ht 76.0 in | Wt 167.0 lb

## 2022-03-18 DIAGNOSIS — K420 Umbilical hernia with obstruction, without gangrene: Secondary | ICD-10-CM

## 2022-03-18 NOTE — Progress Notes (Signed)
[facility] Note   HPI:  51 y.o. Male presents to clinic for post-op follow-up evaluation of his umbilical hernia repair with mesh after incarcerated hernia. Patient reports he doing better and eating but he is not gaining weight. Since all of his issues started 3 weeks before seeing me in the hospital he is down from 200 to 167 lbs today.   Review of Systems:  No pain No fevers Eating and tolerating diet Regular BMs All other review of systems: otherwise negative   Vital Signs:  BP (!) 160/73   Pulse 70   Temp 98.2 F (36.8 C) (Oral)   Resp 12   Ht 6\' 4"  (1.93 m)   Wt 167 lb (75.8 kg)   SpO2 96%   BMI 20.33 kg/m    Physical Exam:  Physical Exam Vitals reviewed.  Cardiovascular:     Rate and Rhythm: Normal rate.  Pulmonary:     Effort: Pulmonary effort is normal.  Abdominal:     General: There is no distension.     Palpations: Abdomen is soft.     Tenderness: There is no abdominal tenderness.     Hernia: No hernia is present.     Comments: Induration at site, no hernia, dermabond peeling, no erythema or drainage  Neurological:     General: No focal deficit present.     Mental Status: He is alert.  Psychiatric:        Mood and Affect: Mood normal.       Assessment:  51 y.o. yo Male with repair of umbilical hernia with mesh for an incarcerated umbilical hernia with SBO on 02/13/22. He is improving and healing but hast lost a significant amount of weight likely from having intermittent partial obstructions leading up to his repair for the 3 weeks he was not eating.  Plan:  No heavy lifting > 10 lbs, excessive bending, pushing, pulling, or squatting for 6-8 weeks after surgery.  Take 1 Can of Boost/ Ensure with every meal and at night time before bed (TID with meals and QHS).   All of the above recommendations were discussed with the patient , and all of patient's were answered to his expressed satisfaction.  PRN Follow up  04/15/22,  [address] [facility] [address], [place] [phone] (office)

## 2022-03-18 NOTE — Patient Instructions (Signed)
No heavy lifting > 10 lbs, excessive bending, pushing, pulling, or squatting for 6-8 weeks after surgery.  Take 1 Can of Boost/ Ensure with every meal and at night time before bed (TID with meals and QHS).

## 2023-08-08 IMAGING — CT CT ABD-PELV W/ CM
2 of 5 series · 16 of 46 positions shown, 18 images · IV contrast (Omnipaque or Isovue)
Comparison: Abdominal radiograph dated 02/11/2022 and CT of the
chest abdomen pelvis dated 04/13/2012.

CLINICAL DATA: Concern for bowel obstruction.

EXAM:
CT ABDOMEN AND PELVIS WITH CONTRAST
TECHNIQUE: Multidetector CT imaging of the abdomen and pelvis was performed
using the standard protocol following bolus administration of
intravenous contrast.

[Series 2: axial st · axial · 0.83mm/px · z∈[-956,-451]mm · 13 of 113 slices shown, 15 images]
[im 6/113  soft-tissue]
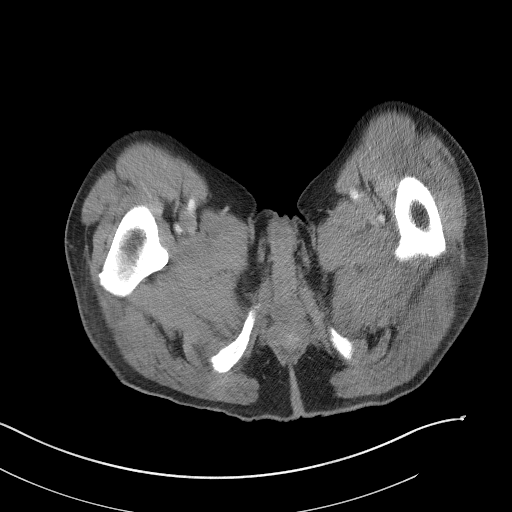
[im 6/113  bone]
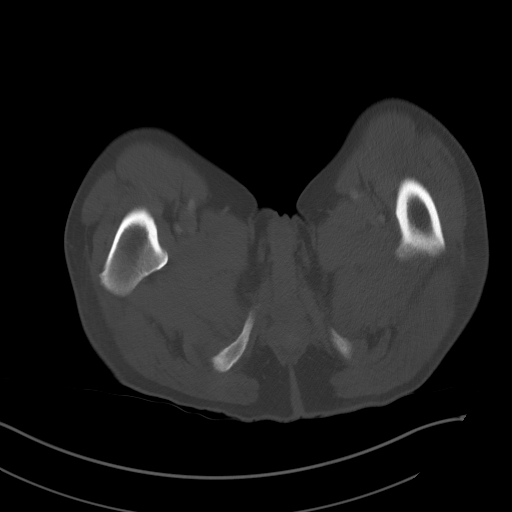
[im 18/113  soft-tissue]
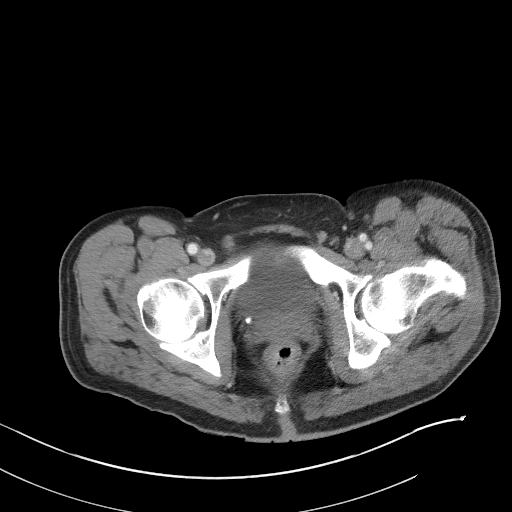
[im 24/113  soft-tissue]
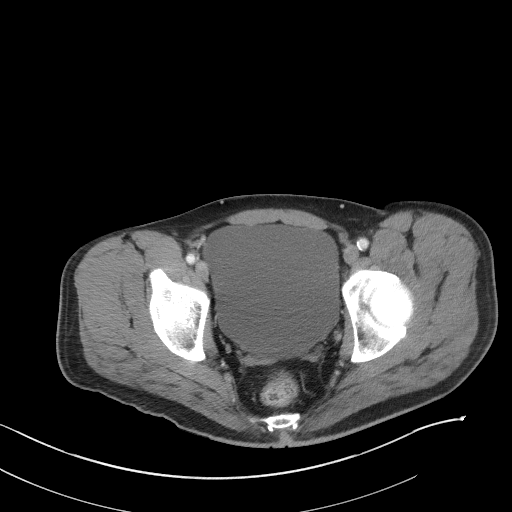
[im 30/113  soft-tissue]
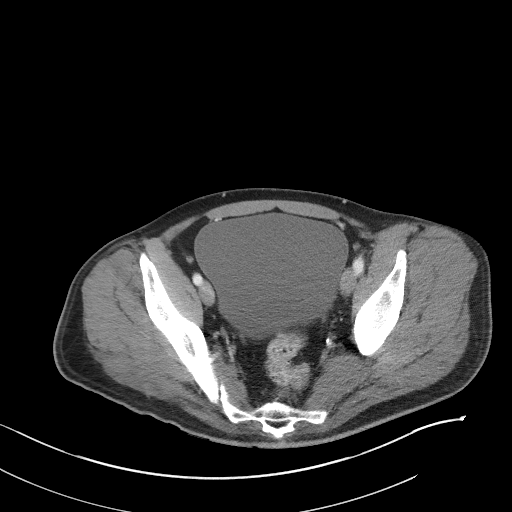
[im 42/113  soft-tissue]
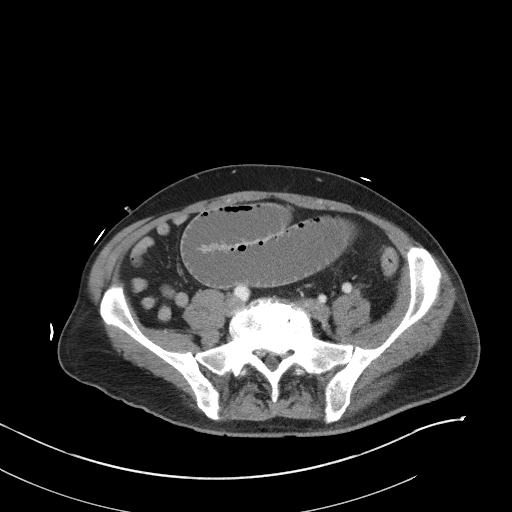
[im 48/113  soft-tissue]
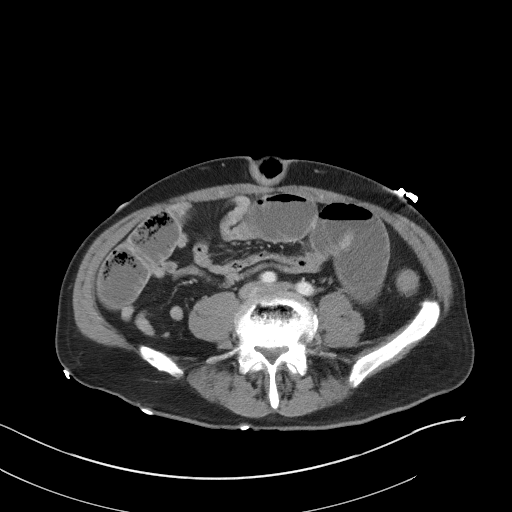
[im 59/113  soft-tissue]
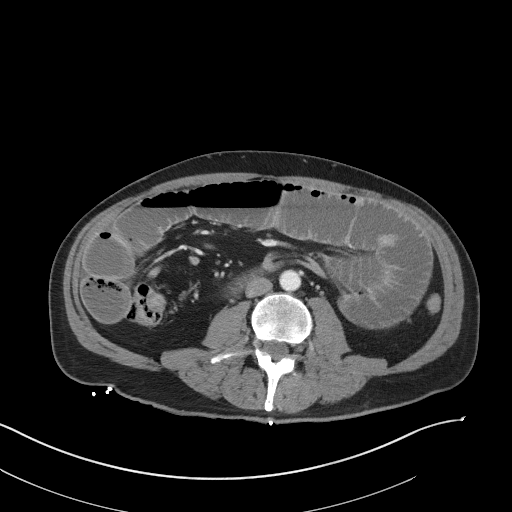
[im 65/113  soft-tissue]
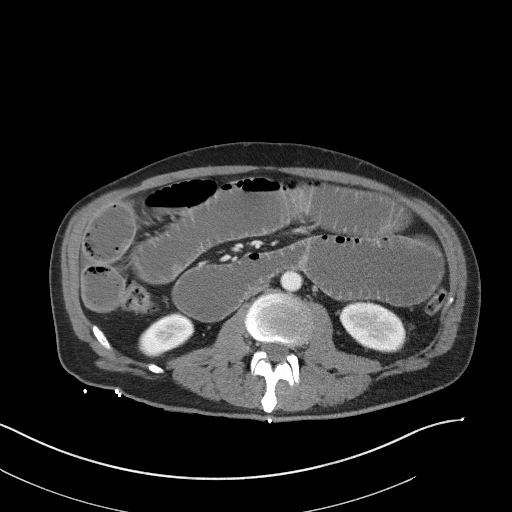
[im 71/113  soft-tissue]
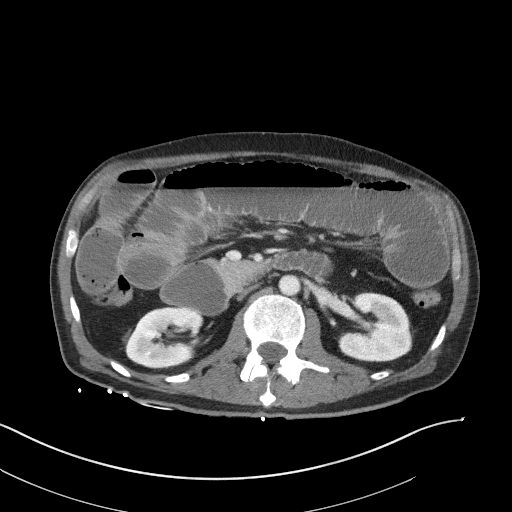
[im 71/113  bone]
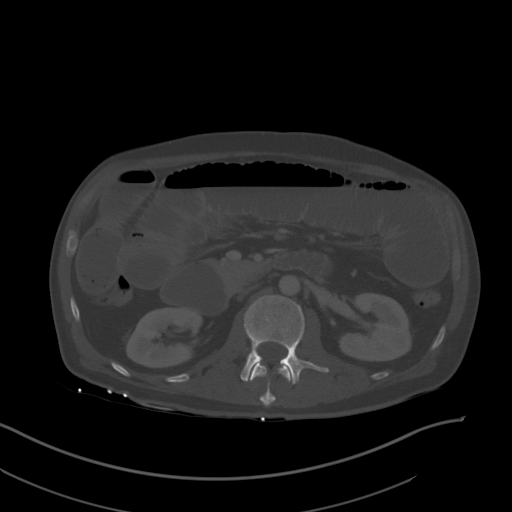
[im 83/113  soft-tissue]
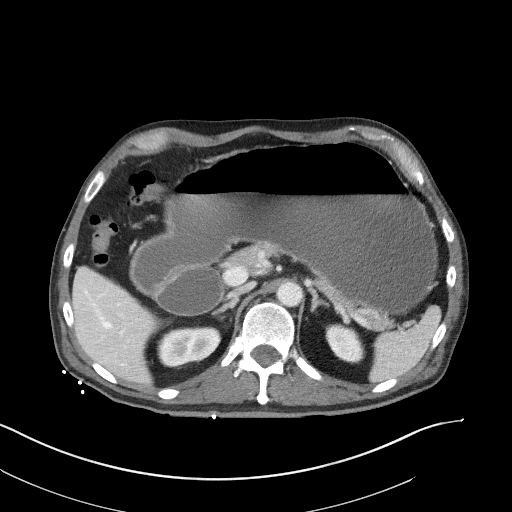
[im 89/113  soft-tissue]
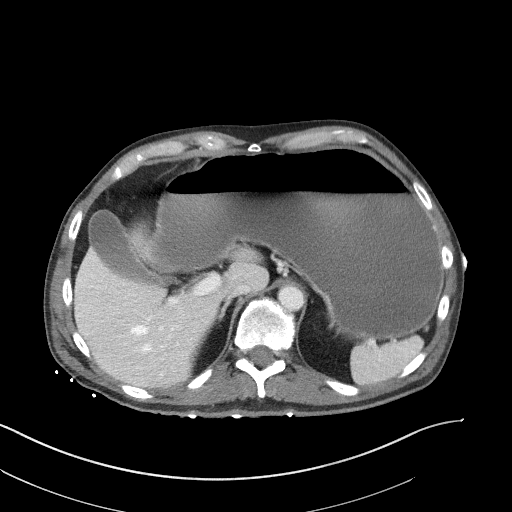
[im 95/113  soft-tissue]
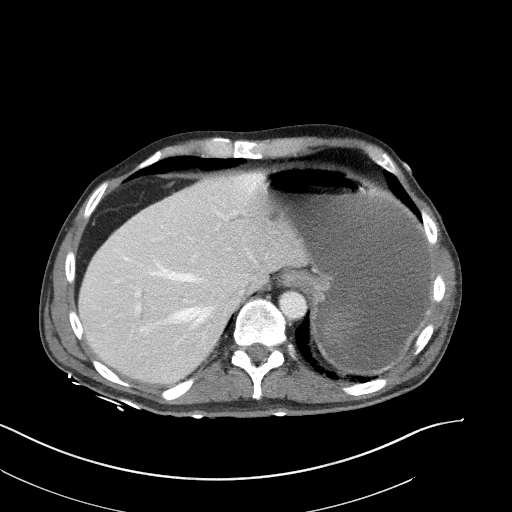
[im 107/113  soft-tissue]
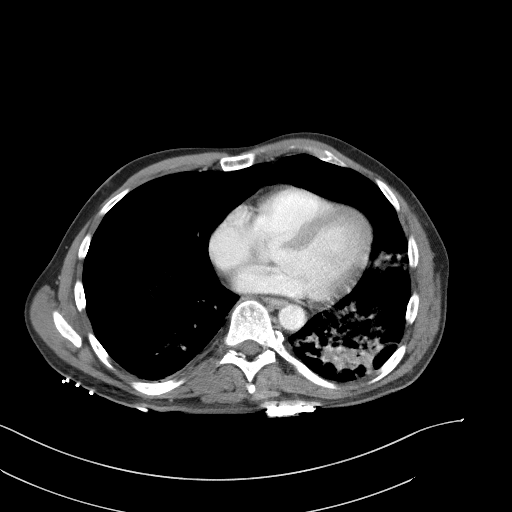

[Series 5: coronal st · coronal · 0.98mm/px · 3 of 103 slices shown]
[im 35/103  soft-tissue]
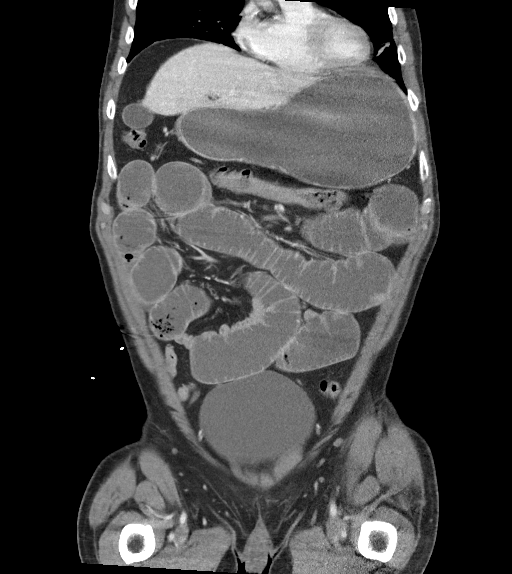
[im 46/103  soft-tissue]
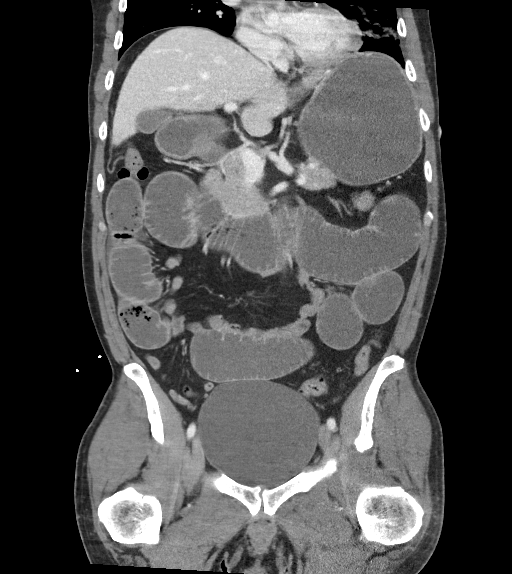
[im 57/103  soft-tissue]
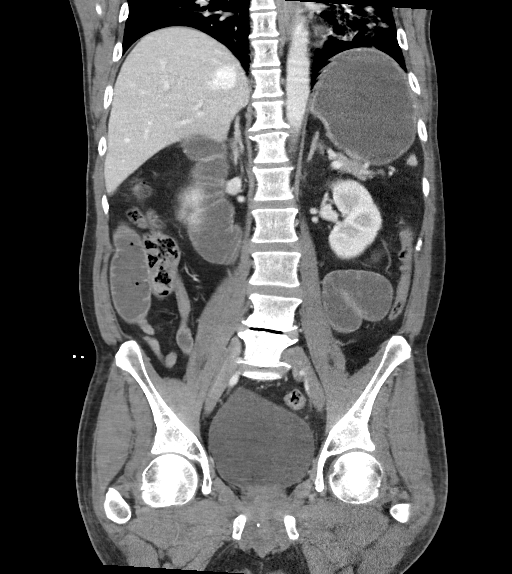

[16 of 46 positions shown; findings below may reference images not displayed]

RADIATION DOSE REDUCTION: This exam was performed according to the
departmental dose-optimization program which includes automated
exposure control, adjustment of the mA and/or kV according to
patient size and/or use of iterative reconstruction technique.

CONTRAST:  100mL OMNIPAQUE IOHEXOL 300 MG/ML  SOLN
FINDINGS: Lower chest: Large area of airspace opacity involving the visualized
left lung base most concerning for pneumonia or aspiration. Clinical
correlation and follow-up is recommended. There is coronary vascular
calcification.

No intra-abdominal free air or free fluid.

Hepatobiliary: Small cysts in the left lobe of the liver measuring
up to 15 mm. Additional subcentimeter hypodense lesion is too small
to characterize. No intrahepatic biliary ductal dilatation. The
gallbladder is unremarkable.

Pancreas: Unremarkable. No pancreatic ductal dilatation or
surrounding inflammatory changes.

Spleen: Normal in size without focal abnormality.

Adrenals/Urinary Tract: The adrenal glands are unremarkable. There
is no hydronephrosis on either side. There is symmetric enhancement
and excretion of contrast by both kidneys. The visualized ureters
and urinary bladder appear unremarkable.

Stomach/Bowel: There is herniation of a loop of small bowel into the
umbilicus. There is pinching of the herniated bowel at the neck of
the hernia with associated small bowel obstruction. The small bowel
loops proximal to the hernia are dilated measuring up to 5.5 cm in
caliber. The distal small bowel are collapsed. The colon is
unremarkable. The appendix is normal.

Vascular/Lymphatic: The abdominal aorta and IVC are unremarkable. No
portal venous gas. There is no adenopathy.

Reproductive: The prostate and seminal vesicles are grossly
unremarkable. No pelvic mass.

Other: None

Musculoskeletal: Degenerative changes of the spine. No acute osseous
pathology.
IMPRESSION: 1. Small-bowel obstruction secondary to umbilical hernia. Surgical
consult is advised.
2. Left lung base pneumonia or aspiration.  Follow-up recommended.
